# Patient Record
Sex: Female | Born: 1965 | Race: White | Hispanic: No | Marital: Married | Smoking: Former smoker
Health system: Southern US, Community
[De-identification: ages and names within clinical notes are randomized; demographics above are authoritative.]

## PROBLEM LIST (undated history)

## (undated) DIAGNOSIS — M79602 Pain in left arm: Secondary | ICD-10-CM

## (undated) DIAGNOSIS — E785 Hyperlipidemia, unspecified: Secondary | ICD-10-CM

## (undated) DIAGNOSIS — Z8249 Family history of ischemic heart disease and other diseases of the circulatory system: Secondary | ICD-10-CM

## (undated) DIAGNOSIS — R0789 Other chest pain: Secondary | ICD-10-CM

## (undated) DIAGNOSIS — I209 Angina pectoris, unspecified: Secondary | ICD-10-CM

## (undated) DIAGNOSIS — E041 Nontoxic single thyroid nodule: Secondary | ICD-10-CM

## (undated) DIAGNOSIS — M25512 Pain in left shoulder: Secondary | ICD-10-CM

## (undated) DIAGNOSIS — E7800 Pure hypercholesterolemia, unspecified: Secondary | ICD-10-CM

## (undated) HISTORY — DX: Other chest pain: R07.89

## (undated) HISTORY — PX: WISDOM TOOTH EXTRACTION: SHX21

## (undated) HISTORY — DX: Family history of ischemic heart disease and other diseases of the circulatory system: Z82.49

## (undated) HISTORY — DX: Pure hypercholesterolemia, unspecified: E78.00

## (undated) HISTORY — DX: Pain in left arm: M79.602

## (undated) HISTORY — DX: Hyperlipidemia, unspecified: E78.5

## (undated) HISTORY — DX: Pain in left shoulder: M25.512

---

## 1997-12-13 ENCOUNTER — Ambulatory Visit (HOSPITAL_COMMUNITY): Admission: RE | Admit: 1997-12-13 | Discharge: 1997-12-13 | Payer: Self-pay | Admitting: Obstetrics and Gynecology

## 1998-02-28 ENCOUNTER — Other Ambulatory Visit: Admission: RE | Admit: 1998-02-28 | Discharge: 1998-02-28 | Payer: Self-pay | Admitting: Obstetrics and Gynecology

## 1998-09-25 ENCOUNTER — Inpatient Hospital Stay (HOSPITAL_COMMUNITY): Admission: AD | Admit: 1998-09-25 | Discharge: 1998-09-28 | Payer: Self-pay | Admitting: Obstetrics and Gynecology

## 1998-11-08 ENCOUNTER — Other Ambulatory Visit: Admission: RE | Admit: 1998-11-08 | Discharge: 1998-11-08 | Payer: Self-pay | Admitting: Obstetrics and Gynecology

## 1999-09-16 HISTORY — PX: TUBAL LIGATION: SHX77

## 2000-01-13 ENCOUNTER — Other Ambulatory Visit: Admission: RE | Admit: 2000-01-13 | Discharge: 2000-01-13 | Payer: Self-pay | Admitting: Obstetrics and Gynecology

## 2000-07-04 ENCOUNTER — Encounter (INDEPENDENT_AMBULATORY_CARE_PROVIDER_SITE_OTHER): Payer: Self-pay

## 2000-07-04 ENCOUNTER — Inpatient Hospital Stay (HOSPITAL_COMMUNITY): Admission: AD | Admit: 2000-07-04 | Discharge: 2000-07-06 | Payer: Self-pay | Admitting: Obstetrics and Gynecology

## 2000-08-17 ENCOUNTER — Other Ambulatory Visit: Admission: RE | Admit: 2000-08-17 | Discharge: 2000-08-17 | Payer: Self-pay | Admitting: Obstetrics and Gynecology

## 2001-08-18 ENCOUNTER — Other Ambulatory Visit: Admission: RE | Admit: 2001-08-18 | Discharge: 2001-08-18 | Payer: Self-pay | Admitting: Obstetrics and Gynecology

## 2002-09-21 ENCOUNTER — Other Ambulatory Visit: Admission: RE | Admit: 2002-09-21 | Discharge: 2002-09-21 | Payer: Self-pay | Admitting: Obstetrics and Gynecology

## 2003-11-03 ENCOUNTER — Other Ambulatory Visit: Admission: RE | Admit: 2003-11-03 | Discharge: 2003-11-03 | Payer: Self-pay | Admitting: Obstetrics and Gynecology

## 2004-11-15 ENCOUNTER — Other Ambulatory Visit: Admission: RE | Admit: 2004-11-15 | Discharge: 2004-11-15 | Payer: Self-pay | Admitting: Obstetrics and Gynecology

## 2005-12-18 ENCOUNTER — Other Ambulatory Visit: Admission: RE | Admit: 2005-12-18 | Discharge: 2005-12-18 | Payer: Self-pay | Admitting: Obstetrics and Gynecology

## 2006-11-13 ENCOUNTER — Ambulatory Visit: Payer: Self-pay | Admitting: Family Medicine

## 2007-06-07 ENCOUNTER — Ambulatory Visit: Payer: Self-pay | Admitting: Family Medicine

## 2007-06-07 DIAGNOSIS — M940 Chondrocostal junction syndrome [Tietze]: Secondary | ICD-10-CM | POA: Insufficient documentation

## 2008-01-13 ENCOUNTER — Encounter: Admission: RE | Admit: 2008-01-13 | Discharge: 2008-01-13 | Payer: Self-pay | Admitting: Obstetrics and Gynecology

## 2008-06-16 ENCOUNTER — Encounter: Admission: RE | Admit: 2008-06-16 | Discharge: 2008-06-16 | Payer: Self-pay | Admitting: Obstetrics and Gynecology

## 2008-07-31 ENCOUNTER — Encounter: Admission: RE | Admit: 2008-07-31 | Discharge: 2008-07-31 | Payer: Self-pay | Admitting: *Deleted

## 2008-07-31 ENCOUNTER — Encounter (INDEPENDENT_AMBULATORY_CARE_PROVIDER_SITE_OTHER): Payer: Self-pay | Admitting: Diagnostic Radiology

## 2009-03-14 ENCOUNTER — Encounter: Admission: RE | Admit: 2009-03-14 | Discharge: 2009-03-14 | Payer: Self-pay | Admitting: Obstetrics and Gynecology

## 2009-07-21 ENCOUNTER — Emergency Department (HOSPITAL_COMMUNITY): Admission: EM | Admit: 2009-07-21 | Discharge: 2009-07-21 | Payer: Self-pay | Admitting: Emergency Medicine

## 2010-10-03 ENCOUNTER — Ambulatory Visit: Payer: Self-pay | Admitting: Cardiology

## 2010-10-05 ENCOUNTER — Encounter: Payer: Self-pay | Admitting: Obstetrics and Gynecology

## 2010-10-08 ENCOUNTER — Ambulatory Visit: Payer: Self-pay | Admitting: Cardiology

## 2010-12-18 LAB — POCT CARDIAC MARKERS
CKMB, poc: <1 ng/mL — ABNORMAL LOW (ref 1.0–8.0)
Myoglobin, poc: 48.6 ng/mL (ref 12–200)

## 2010-12-18 LAB — DIFFERENTIAL
Basophils Absolute: 0.1 10*3/uL (ref 0.0–0.1)
Basophils Relative: 1 % (ref 0–1)
Eosinophils Absolute: 0.2 10*3/uL (ref 0.0–0.7)
Eosinophils Relative: 2 % (ref 0–5)
Monocytes Absolute: 0.6 10*3/uL (ref 0.1–1.0)
Monocytes Relative: 6 % (ref 3–12)

## 2010-12-18 LAB — BASIC METABOLIC PANEL
CO2: 27 mEq/L (ref 19–32)
Calcium: 9.1 mg/dL (ref 8.4–10.5)
Chloride: 104 mEq/L (ref 96–112)
Glucose, Bld: 91 mg/dL (ref 70–99)
Sodium: 138 mEq/L (ref 135–145)

## 2010-12-18 LAB — CBC
HCT: 38.1 % (ref 36.0–46.0)
Hemoglobin: 12.8 g/dL (ref 12.0–15.0)
MCHC: 33.7 g/dL (ref 30.0–36.0)
MCV: 86.6 fL (ref 78.0–100.0)
RBC: 4.4 MIL/uL (ref 3.87–5.11)
RDW: 12.1 % (ref 11.5–15.5)

## 2011-01-31 NOTE — Op Note (Signed)
[facility]  Patient:    Kim Lee, Kim Lee                   MRN: [id number] Proc. Date: 07/04/00 Adm. Date:  [id number] Attending:  Frederich Balding Dictator:   n                           Operative Report  PREOPERATIVE DIAGNOSIS:       Multiparity, desires sterility.  POSTOPERATIVE DIAGNOSIS:      Multiparity, desires sterility.  OPERATION:                    Postpartum bilateral tubal ligation.  SURGEON:                      Juluis Mire, M.D.  ANESTHESIA:                   General endotracheal.  ESTIMATED BLOOD LOSS:         Minimal.  PACKS AND DRAINS:             None.  INTRAOPERATIVE BLOOD:         None.  COMPLICATIONS:                None.  INDICATIONS:                  The patient is a 45 year old gravida 5, para 4, abortus 1, married white female who delivered yesterday.  She was desirous of permanent sterilization.  Alternative forms of birth control were discussed. A failure rate of 1-200 was quoted.  Failures can be in the form of ectopic pregnancy requiring further surgical management.  The risks of surgery had been discussed including the risk of anesthesia.  The risk of infection.  The risk of hemorrhage that could necessitate transfusion with the risk of AIDS or hepatitis.  The risk of injury to adjacent organs could require further exploratory surgery.  The risk of a deviance or pulmonary embolus.  The patient voiced understanding of indications, risks, and other options.  DESCRIPTION OF PROCEDURE:     The patient was taken to the operating room and placed in the supine position.  After a satisfactory level of general endotracheal anesthesia had been obtained, the abdomen was prepped out with Betadine and draped in a sterile field.  A subumbilical incision was then made a knife and carried through the subcutaneous tissue.  The fascia was identified and entered sharply and the incision was extended laterally.   The peritoneum was identified and entered sharply.  There was no evidence of injury to adjacent organs.  The left tube was first identified and follow out to its fimbriated end.  A midsegment of tube was elevated through the incision.  A hole was made in the avascular mesosalpinx.  Individual ligatures of 0 plain catgut were used to ligate off the segment and tube.  The remaining segment and tube was then excised.  Cut ends of the tubes were cauterized using the Bovie.  Hemostasis was excellent with good tubal separation.  The left ovary was palpably normal.  Next, the right tube was identified by following it out to its fimbriated end.  A midsegment of tube was elevated through the incision.  A hole was made in the avascular area of the mesosalpinx, and individual ligatures of 0 plain catgut was used to ligate off  a segment of tube.  The intervening segment of tube was then excised.  Cut end of the tubes were then cauterized using the Bovie.  We had good hemostasis. Right ovary appeared and felt normal.  We had good hemostasis bilaterally at this point in time.  The fascia was closed with a running suture of 0 Vicryl. The skin was closed with a running subcuticular 4-0 Vicryl.  Sponge, needle and instrument counts reported as correct by the circulating nurse x 2.  The patient tolerated the procedure well and once was extubated, was transferred to the recovery room in good condition. DD:  07/05/00 TD:  07/05/00 Job: [id number]

## 2011-05-05 ENCOUNTER — Encounter: Payer: Self-pay | Admitting: *Deleted

## 2011-05-05 ENCOUNTER — Other Ambulatory Visit: Payer: Self-pay | Admitting: Cardiology

## 2011-05-05 DIAGNOSIS — Z8249 Family history of ischemic heart disease and other diseases of the circulatory system: Secondary | ICD-10-CM | POA: Insufficient documentation

## 2011-05-05 DIAGNOSIS — E7800 Pure hypercholesterolemia, unspecified: Secondary | ICD-10-CM

## 2011-05-08 ENCOUNTER — Other Ambulatory Visit (INDEPENDENT_AMBULATORY_CARE_PROVIDER_SITE_OTHER): Payer: BC Managed Care – PPO | Admitting: *Deleted

## 2011-05-08 DIAGNOSIS — E7800 Pure hypercholesterolemia, unspecified: Secondary | ICD-10-CM

## 2011-05-08 DIAGNOSIS — Z8249 Family history of ischemic heart disease and other diseases of the circulatory system: Secondary | ICD-10-CM

## 2011-05-08 LAB — BASIC METABOLIC PANEL
BUN: 11 mg/dL (ref 6–23)
CO2: 28 mEq/L (ref 19–32)
Chloride: 106 mEq/L (ref 96–112)
Creatinine, Ser: 0.7 mg/dL (ref 0.4–1.2)
Potassium: 4.5 mEq/L (ref 3.5–5.1)

## 2011-05-08 LAB — LIPID PANEL
Cholesterol: 195 mg/dL (ref 0–200)
LDL Cholesterol: 129 mg/dL — ABNORMAL HIGH (ref 0–99)
Triglycerides: 67 mg/dL (ref 0.0–149.0)

## 2011-05-08 LAB — HEPATIC FUNCTION PANEL
ALT: 13 U/L (ref 0–35)
AST: 15 U/L (ref 0–37)
Bilirubin, Direct: 0.1 mg/dL (ref 0.0–0.3)
Total Bilirubin: 0.6 mg/dL (ref 0.3–1.2)
Total Protein: 7 g/dL (ref 6.0–8.3)

## 2011-05-14 ENCOUNTER — Ambulatory Visit (INDEPENDENT_AMBULATORY_CARE_PROVIDER_SITE_OTHER): Payer: BC Managed Care – PPO | Admitting: Cardiology

## 2011-05-14 ENCOUNTER — Encounter: Payer: Self-pay | Admitting: Cardiology

## 2011-05-14 VITALS — BP 100/70 | HR 64 | Wt 147.0 lb

## 2011-05-14 DIAGNOSIS — Z8249 Family history of ischemic heart disease and other diseases of the circulatory system: Secondary | ICD-10-CM

## 2011-05-14 DIAGNOSIS — E7800 Pure hypercholesterolemia, unspecified: Secondary | ICD-10-CM

## 2011-05-14 MED ORDER — ATORVASTATIN CALCIUM 20 MG PO TABS: 20.0000 mg | ORAL_TABLET | Freq: Every day | ORAL | Status: DC

## 2011-05-14 NOTE — Assessment & Plan Note (Signed)
The patient has felt well.  She has been off her statin therapy.  We gave her a new prescription for Lipitor 20 mg daily today and she will remember to start taking it.

## 2011-05-14 NOTE — Assessment & Plan Note (Signed)
The patient has a family history of coronary disease but she herself has had no symptoms of angina and she had a previous normal stress test.  She is thinking about running a 5K race and wonders what I thought about it and I told her that I thought that it would be a good idea

## 2011-05-14 NOTE — Progress Notes (Signed)
Kim Lee Date of Birth:  1966/04/23 [facility] / [facility] [address]   [address] [place], [place]  [address] [phone]           Fax   [phone]  HPI: This pleasant 45 year old woman has a history of hypercholesterolemia.  She has not been taking her Lipitor for the past 2 or 3 months.  She just simply forgot to take it over the summer.  Her blood work today reflects the fact that she has not been on any statin and her LDL is high.  She denies any chest pain or shortness of breath.  She does not have any history of ischemic heart disease and she had a normal treadmill Cardiolite stress test 07/31/09  Current Outpatient Prescriptions  Medication Sig Dispense Refill  . atorvastatin (LIPITOR) 20 MG tablet Take 1 tablet (20 mg total) by mouth daily.  90 tablet  3  . FLUoxetine (PROZAC) 20 MG capsule Take 20 mg by mouth. 2 (two) weeks a month       . nitroGLYCERIN (NITROSTAT) 0.4 MG SL tablet Place 0.4 mg under the tongue every 5 (five) minutes as needed.          No Known Allergies  Patient Active Problem List  Diagnoses  . COSTOCHONDRITIS  . Hypercholesterolemia  . Family history of premature CAD    History  Smoking status  . Former Smoker -- 1.0 packs/day for 15 years  . Types: Cigarettes  . Quit date: 05/05/1999  Smokeless tobacco  . Never Used    History  Alcohol Use No    Family History  Problem Relation Age of Onset  . Diabetes Father   . Cancer Father   . Coronary artery disease Mother     with stents  . Hypertension Mother   . Hypertension Sister     Review of Systems: The patient denies any heat or cold intolerance.  No weight gain or weight loss.  The patient denies headaches or blurry vision.  There is no cough or sputum production.  The patient denies dizziness.  There is no hematuria or hematochezia.  The patient denies any muscle aches or arthritis.  The patient denies any rash.  The patient denies frequent falling or  instability.  There is no history of depression or anxiety.  All other systems were reviewed and are negative.   Physical Exam: Filed Vitals:   05/14/11 1608  BP: 100/70  Pulse: 64  The general appearance reveals a well-developed well-nourished woman in no distress.The head and neck exam reveals pupils equal and reactive.  Extraocular movements are full.  There is no scleral icterus.  The mouth and pharynx are normal.  The neck is supple.  The carotids reveal no bruits.  The jugular venous pressure is normal.  The  thyroid is not enlarged.  There is no lymphadenopathy.  The chest is clear to percussion and auscultation.  There are no rales or rhonchi.  Expansion of the chest is symmetrical.  The precordium is quiet.  The first heart sound is normal.  The second heart sound is physiologically split.  There is no murmur gallop rub or click.  There is no abnormal lift or heave.  The abdomen is soft and nontender.  The bowel sounds are normal.  The liver and spleen are not enlarged.  There are no abdominal masses.  There are no abdominal bruits.  Extremities reveal good pedal pulses.  There is no phlebitis or edema.  There is no cyanosis or clubbing.  Strength is normal and symmetrical in all extremities.  There is no lateralizing weakness.  There are no sensory deficits.  The skin is warm and dry.  There is no rash.      Assessment / Plan:  Restart Lipitor 20 mg daily.  Recheck 6 months for followup office visit and fasting lab work.  Okay to start training for a 5K race

## 2011-07-29 ENCOUNTER — Other Ambulatory Visit: Payer: Self-pay | Admitting: Orthopedic Surgery

## 2011-07-30 ENCOUNTER — Encounter (HOSPITAL_BASED_OUTPATIENT_CLINIC_OR_DEPARTMENT_OTHER): Payer: Self-pay | Admitting: Anesthesiology

## 2011-07-30 ENCOUNTER — Other Ambulatory Visit: Payer: Self-pay | Admitting: Orthopedic Surgery

## 2011-07-30 ENCOUNTER — Encounter (HOSPITAL_BASED_OUTPATIENT_CLINIC_OR_DEPARTMENT_OTHER)
Admission: RE | Disposition: A | Discharge status: Home / Self Care | Payer: Self-pay | Source: Ambulatory Visit | Attending: Orthopedic Surgery

## 2011-07-30 ENCOUNTER — Ambulatory Visit (HOSPITAL_BASED_OUTPATIENT_CLINIC_OR_DEPARTMENT_OTHER)
Admission: RE | Admit: 2011-07-30 | Discharge: 2011-07-30 | Disposition: A | Discharge status: Home / Self Care | Payer: BC Managed Care – PPO | Source: Ambulatory Visit | Attending: Orthopedic Surgery | Admitting: Orthopedic Surgery

## 2011-07-30 ENCOUNTER — Encounter (HOSPITAL_BASED_OUTPATIENT_CLINIC_OR_DEPARTMENT_OTHER): Payer: Self-pay | Admitting: *Deleted

## 2011-07-30 ENCOUNTER — Ambulatory Visit (HOSPITAL_BASED_OUTPATIENT_CLINIC_OR_DEPARTMENT_OTHER): Payer: BC Managed Care – PPO | Admitting: Anesthesiology

## 2011-07-30 DIAGNOSIS — I82891 Chronic embolism and thrombosis of other specified veins: Secondary | ICD-10-CM | POA: Insufficient documentation

## 2011-07-30 HISTORY — DX: Angina pectoris, unspecified: I20.9

## 2011-07-30 HISTORY — PX: MASS EXCISION: SHX2000

## 2011-07-30 LAB — POCT I-STAT, CHEM 8
Creatinine, Ser: 0.6 mg/dL (ref 0.50–1.10)
Glucose, Bld: 84 mg/dL (ref 70–99)
HCT: 42 % (ref 36.0–46.0)
Hemoglobin: 14.3 g/dL (ref 12.0–15.0)
Potassium: 3.9 mEq/L (ref 3.5–5.1)
Sodium: 139 mEq/L (ref 135–145)
TCO2: 20 mmol/L (ref 0–100)

## 2011-07-30 SURGERY — EXCISION MASS
Anesthesia: Choice | Laterality: Left | Wound class: Clean

## 2011-07-30 MED ORDER — BUPIVACAINE HCL (PF) 0.25 % IJ SOLN
INTRAMUSCULAR | Status: DC | PRN
  Administered 2011-07-30: 2 mL

## 2011-07-30 MED ORDER — LACTATED RINGERS IV SOLN
INTRAVENOUS | Status: DC
  Administered 2011-07-30 (×2): via INTRAVENOUS

## 2011-07-30 MED ORDER — MEPERIDINE HCL 25 MG/ML IJ SOLN
6.2500 mg | INTRAMUSCULAR | Status: DC | PRN
Start: 2011-07-30 — End: 2011-07-30

## 2011-07-30 MED ORDER — ONDANSETRON HCL 4 MG/2ML IJ SOLN: 4.0000 mg | Freq: Once | INTRAMUSCULAR | Status: DC | PRN

## 2011-07-30 MED ORDER — HYDROMORPHONE HCL PF 1 MG/ML IJ SOLN: 0.2500 mg | INTRAMUSCULAR | Status: DC | PRN

## 2011-07-30 MED ORDER — MIDAZOLAM HCL 5 MG/5ML IJ SOLN
INTRAMUSCULAR | Status: DC | PRN
  Administered 2011-07-30: 2 mg via INTRAVENOUS

## 2011-07-30 MED ORDER — CEFAZOLIN SODIUM 1-5 GM-% IV SOLN: 1.0000 g | Freq: Once | INTRAVENOUS | Status: DC

## 2011-07-30 MED ORDER — LIDOCAINE HCL (PF) 0.5 % IJ SOLN
INTRAMUSCULAR | Status: DC | PRN
  Administered 2011-07-30: 6 mL

## 2011-07-30 MED ORDER — DEXAMETHASONE SODIUM PHOSPHATE 10 MG/ML IJ SOLN
INTRAMUSCULAR | Status: DC | PRN
  Administered 2011-07-30: 10 mg via INTRAVENOUS

## 2011-07-30 MED ORDER — CHLORHEXIDINE GLUCONATE 4 % EX LIQD: 60.0000 mL | Freq: Once | CUTANEOUS | Status: DC

## 2011-07-30 MED ORDER — HYDROCODONE-ACETAMINOPHEN 5-500 MG PO TABS: 1.0000 | ORAL_TABLET | Freq: Four times a day (QID) | ORAL | Status: AC | PRN

## 2011-07-30 MED ORDER — FENTANYL CITRATE 0.05 MG/ML IJ SOLN
INTRAMUSCULAR | Status: DC | PRN
  Administered 2011-07-30: 50 ug via INTRAVENOUS

## 2011-07-30 MED ORDER — ONDANSETRON HCL 4 MG/2ML IJ SOLN
INTRAMUSCULAR | Status: DC | PRN
  Administered 2011-07-30: 4 mg via INTRAVENOUS

## 2011-07-30 MED ORDER — PROPOFOL 10 MG/ML IV EMUL
INTRAVENOUS | Status: DC | PRN
  Administered 2011-07-30: 75 ug/kg/min via INTRAVENOUS

## 2011-07-30 MED ORDER — MORPHINE SULFATE 2 MG/ML IJ SOLN: 0.0500 mg/kg | INTRAMUSCULAR | Status: DC | PRN

## 2011-07-30 MED ORDER — CEFAZOLIN SODIUM 1-5 GM-% IV SOLN
INTRAVENOUS | Status: DC | PRN
  Administered 2011-07-30: 1 g via INTRAVENOUS

## 2011-07-30 SURGICAL SUPPLY — 24 items
BANDAGE COBAN STERILE 2 (GAUZE/BANDAGES/DRESSINGS) ×1 IMPLANT
BLADE SURG 15 STRL LF DISP TIS (BLADE) IMPLANT
BLADE SURG 15 STRL SS (BLADE) ×2
CHLORAPREP W/TINT 26ML (MISCELLANEOUS) ×1 IMPLANT
CLOTH BEACON ORANGE TIMEOUT ST (SAFETY) ×1 IMPLANT
CORDS BIPOLAR (ELECTRODE) ×1 IMPLANT
COVER MAYO STAND STRL (DRAPES) ×1 IMPLANT
COVER TABLE BACK 60X90 (DRAPES) ×1 IMPLANT
CUFF TOURNIQUET SINGLE 18IN (TOURNIQUET CUFF) ×1 IMPLANT
DRAPE EXTREMITY T 121X128X90 (DRAPE) ×1 IMPLANT
DRAPE SURG 17X23 STRL (DRAPES) ×1 IMPLANT
GLOVE SURG ORTHO 8.0 STRL STRW (GLOVE) ×1 IMPLANT
GOWN BRE IMP PREV XXLGXLNG (GOWN DISPOSABLE) ×1 IMPLANT
GOWN PREVENTION PLUS XLARGE (GOWN DISPOSABLE) ×1 IMPLANT
NEEDLE 27GAX1X1/2 (NEEDLE) ×1 IMPLANT
NS IRRIG 1000ML POUR BTL (IV SOLUTION) ×1 IMPLANT
PACK BASIN DAY SURGERY FS (CUSTOM PROCEDURE TRAY) ×1 IMPLANT
SPONGE GAUZE 4X4 12PLY (GAUZE/BANDAGES/DRESSINGS) ×1 IMPLANT
STOCKINETTE 4X48 STRL (DRAPES) ×1 IMPLANT
SUT VICRYL RAPIDE 4/0 PS 2 (SUTURE) ×1 IMPLANT
SYR BULB 3OZ (MISCELLANEOUS) ×1 IMPLANT
SYR CONTROL 10ML LL (SYRINGE) ×1 IMPLANT
TOWEL OR 17X24 6PK STRL BLUE (TOWEL DISPOSABLE) ×1 IMPLANT
UNDERPAD 30X30 INCONTINENT (UNDERPADS AND DIAPERS) ×1 IMPLANT

## 2011-07-30 NOTE — Anesthesia Preprocedure Evaluation (Addendum)
Anesthesia Evaluation  Patient identified by MRN, date of birth, ID band Patient awake    Reviewed: Allergy & Precautions, H&P , NPO status , Patient's Chart, lab work & pertinent test results  Airway Mallampati: I TM Distance: >3 FB Neck ROM: Full    Dental   Pulmonary    Pulmonary exam normal       Cardiovascular - angina    Neuro/Psych    GI/Hepatic   Endo/Other    Renal/GU      Musculoskeletal   Abdominal   Peds  Hematology   Anesthesia Other Findings   Reproductive/Obstetrics                          Anesthesia Physical Anesthesia Plan  ASA: I  Anesthesia Plan: Bier Block   Post-op Pain Management:    Induction: Intravenous  Airway Management Planned: Simple Face Mask  Additional Equipment:   Intra-op Plan:   Post-operative Plan:   Informed Consent: I have reviewed the patients History and Physical, chart, labs and discussed the procedure including the risks, benefits and alternatives for the proposed anesthesia with the patient or authorized representative who has indicated his/her understanding and acceptance.   Dental advisory given  Plan Discussed with: CRNA and Surgeon  Anesthesia Plan Comments:         Anesthesia Quick Evaluation

## 2011-07-30 NOTE — Op Note (Signed)
Dictated [id number]

## 2011-07-30 NOTE — Transfer of Care (Signed)
Immediate Anesthesia Transfer of Care Note  Patient: Kim Lee  Procedure(s) Performed:  EXCISION MASS - left hand  Patient Location: PACU  Anesthesia Type: MAC  Level of Consciousness: awake, alert  and oriented  Airway & Oxygen Therapy: Patient Spontanous Breathing and Patient connected to face mask oxygen  Post-op Assessment: Report given to PACU RN and Post -op Vital signs reviewed and stable  Post vital signs: Reviewed and stable  Complications: No apparent anesthesia complications

## 2011-07-30 NOTE — Progress Notes (Signed)
Vicodin prescripton called in to pharmacy [facility] and prescripton destroyed

## 2011-07-30 NOTE — H&P (Signed)
[name] is a 45 year old left hand dominant female referred by Dr. Milus Glazier for a consultation with respect to a mass on the dorsal aspect of her left hand between the index and middle finger and the dorsal web space. This was aspirated in July with a thick bloody fluid. It has subsequently recurred. She recalls no history of injury. She is not complaining of any pain or discomfort with it. She has an occasional aching pain and it does cause problems. she states it is getting larger. Activity makes it worse. No history of diabetes, thyroid problems, arthritis or gout. There is a family history of diabetes and she has been tested.hand  Kim Lee is an 45 y.o. female.   Chief Complaint: mass left hand HPI: see above  Past Medical History  Diagnosis Date  . Hypercholesterolemia   . Atypical chest pain     History of   . Left arm pain     History of   . Left shoulder pain     History of   . Family history of premature CAD   . Angina     2 years ago.  1 episode,  Sees Dr. Patty Sermons    Past Surgical History  Procedure Date  . Tubal ligation 2001    Family History  Problem Relation Age of Onset  . Diabetes Father   . Cancer Father   . Coronary artery disease Mother     with stents  . Hypertension Mother   . Hypertension Sister    Social History:  reports that she quit smoking about 12 years ago. Her smoking use included Cigarettes. She has a 15 pack-year smoking history. She has never used smokeless tobacco. She reports that she does not drink alcohol or use illicit drugs.  Allergies: No Known Allergies  Medications Prior to Admission  Medication Dose Route Frequency Provider Last Rate Last Dose  . ceFAZolin (ANCEF) IVPB 1 g/50 mL premix  1 g Intravenous Once Nicki Reaper, MD      . lactated ringers infusion   Intravenous Continuous Nicki Reaper, MD 20 mL/hr at 07/30/11 1415     Medications Prior to Admission  Medication Sig Dispense Refill  . atorvastatin  (LIPITOR) 20 MG tablet Take 1 tablet (20 mg total) by mouth daily.  90 tablet  3  . FLUoxetine (PROZAC) 20 MG capsule Take 20 mg by mouth. 2 (two) weeks a month       . nitroGLYCERIN (NITROSTAT) 0.4 MG SL tablet Place 0.4 mg under the tongue every 5 (five) minutes as needed.         Results for orders placed during the hospital encounter of 07/30/11 (from the past 48 hour(s))  POCT I-STAT, CHEM 8     Status: Abnormal   Collection Time   07/30/11  2:23 PM      Component Value Range Comment   Sodium 139  135 - 145 (mEq/L)    Potassium 3.9  3.5 - 5.1 (mEq/L)    Chloride 108  96 - 112 (mEq/L)    BUN 9  6 - 23 (mg/dL)    Creatinine, Ser 0.45  0.50 - 1.10 (mg/dL)    Glucose, Bld 84  70 - 99 (mg/dL)    Calcium, Ion 4.09 (*) 1.12 - 1.32 (mmol/L)    TCO2 20  0 - 100 (mmol/L)    Hemoglobin 14.3  12.0 - 15.0 (g/dL)    HCT 81.1  91.4 - 78.2 (%)  No results found.   A comprehensive review of systems was negative.  Blood pressure 115/77, pulse 70, temperature 98 F (36.7 C), temperature source Oral, resp. rate 18, height 5\' 6"  (1.676 m), weight 65.772 kg (145 lb), last menstrual period 07/10/2011, SpO2 100.00%.  General appearance: alert, cooperative and appears stated age Head: atraumatic Neck: no adenopathy Resp: clear to auscultation bilaterally Cardio: regular rate and rhythm, S1, S2 normal, no murmur, click, rub or gallop GI: soft, non-tender; bowel sounds normal; no masses,  no organomegaly Extremities: extremities normal, atraumatic, no cyanosis or edema Pulses: 2+ and symmetric Skin: Skin color, texture, turgor normal. No rashes or lesions Neurologic: Grossly normal Incision/Wound: na  Assessment/Plan Excision mass  Andriea Hasegawa R 07/30/2011, 4:05 PM

## 2011-07-30 NOTE — Anesthesia Postprocedure Evaluation (Signed)
  Anesthesia Post-op Note  Patient: Kim Lee  Procedure(s) Performed:  EXCISION MASS - left hand  Patient Location: PACU  Anesthesia Type: Bier block  Level of Consciousness: awake and alert   Airway and Oxygen Therapy: Patient Spontanous Breathing and Patient connected to face mask oxygen  Post-op Pain: mild  Post-op Assessment: Post-op Vital signs reviewed, Patient's Cardiovascular Status Stable, Respiratory Function Stable, Patent Airway, No signs of Nausea or vomiting and Pain level controlled  Post-op Vital Signs: Reviewed and stable  Complications: No apparent anesthesia complications

## 2011-07-30 NOTE — Brief Op Note (Signed)
07/30/2011  4:52 PM  PATIENT:  Kim Lee  45 y.o. female  PRE-OPERATIVE DIAGNOSIS:  mass left hand web, index, middle  POST-OPERATIVE DIAGNOSIS:  mass left hand web, index, middle  PROCEDURE:  Procedure(s): EXCISION MASS  SURGEON:  Surgeon(s): Nicki Reaper, MD Tami Ribas  PHYSICIAN ASSISTANT:   ASSISTANTS: none   ANESTHESIA:   local and regional  EBL:  Total I/O In: 1100 [I.V.:1100] Out: -   BLOOD ADMINISTERED:none  DRAINS: none   LOCAL MEDICATIONS USED:  MARCAINE 2CC  SPECIMEN:  Excision  DISPOSITION OF SPECIMEN:  PATHOLOGY  COUNTS:  YES  TOURNIQUET:   Total Tourniquet Time Documented: Forearm (Left) - 16 minutes  DICTATION: .Other Dictation: Dictation Number [id number]  PLAN OF CARE: Discharge to home after PACU  PATIENT DISPOSITION:  PACU - hemodynamically stable.

## 2011-07-31 NOTE — Op Note (Signed)
NAME:  EVADEAN, SPROULE NO.:  [id number]  MEDICAL RECORD NO.:  LOCATION:                                 FACILITY:  PHYSICIAN:  Cindee Salt, M.D.            DATE OF BIRTH:  DATE OF PROCEDURE: DATE OF DISCHARGE:                              OPERATIVE REPORT   PREOPERATIVE DIAGNOSIS:  Mass, left hand.  POSTOPERATIVE DIAGNOSIS:  Mass, left hand.  OPERATION:  Excisional biopsy mass, left hand.  PROCEDURE IN DETAIL:  The patient was brought to the operating room where a forearm based IV regional anesthetic was carried out without difficulty.  She was prepped using ChloraPrep, supine position, left arm free.  A 3-minute dry time was allowed.  Time-out taken, confirmed patient procedure.  After adequate anesthesia was afforded, a longitudinal incision was made over the mass carried down through subcutaneous tissue, a multilobulated fluid blood-filled mass was immediately apparent. Feeder vessels were noted proximally, distally, and volarly.  These were electrocoagulated with bipolar allowing complete resection and removal of the mass which was sent to Pathology. No further lesions were identified.  The wound was irrigated.  The skin then closed with interrupted 5-0 Vicryl Rapide sutures.  Local infiltration with 0.25% Marcaine without epinephrine was given, 2 mL was used.  A sterile compressive dressing was applied and deflation of the tourniquet.  All fingers immediately pinked.  She was taken to the recovery room for observation in satisfactory condition.          ______________________________ Cindee Salt, M.D.     GK/MEDQ  D:  07/30/2011  T:  07/30/2011  Job:  [id number]

## 2011-08-04 ENCOUNTER — Encounter (HOSPITAL_BASED_OUTPATIENT_CLINIC_OR_DEPARTMENT_OTHER): Payer: Self-pay | Admitting: Orthopedic Surgery

## 2011-12-11 ENCOUNTER — Other Ambulatory Visit: Payer: Self-pay | Admitting: *Deleted

## 2011-12-11 MED ORDER — NITROGLYCERIN 0.4 MG SL SUBL: 0.4000 mg | SUBLINGUAL_TABLET | SUBLINGUAL | Status: DC | PRN

## 2011-12-11 NOTE — Telephone Encounter (Signed)
Refilled ntg °

## 2012-07-12 ENCOUNTER — Encounter: Payer: Self-pay | Admitting: Family Medicine

## 2012-07-12 ENCOUNTER — Ambulatory Visit (INDEPENDENT_AMBULATORY_CARE_PROVIDER_SITE_OTHER): Payer: BC Managed Care – PPO | Admitting: Family Medicine

## 2012-07-12 VITALS — BP 98/70 | HR 75 | Temp 98.5°F | Ht 66.75 in | Wt 148.0 lb

## 2012-07-12 DIAGNOSIS — R1013 Epigastric pain: Secondary | ICD-10-CM | POA: Insufficient documentation

## 2012-07-12 DIAGNOSIS — M25519 Pain in unspecified shoulder: Secondary | ICD-10-CM

## 2012-07-12 DIAGNOSIS — K5900 Constipation, unspecified: Secondary | ICD-10-CM

## 2012-07-12 DIAGNOSIS — Z8249 Family history of ischemic heart disease and other diseases of the circulatory system: Secondary | ICD-10-CM

## 2012-07-12 DIAGNOSIS — E785 Hyperlipidemia, unspecified: Secondary | ICD-10-CM | POA: Insufficient documentation

## 2012-07-12 DIAGNOSIS — Z7689 Persons encountering health services in other specified circumstances: Secondary | ICD-10-CM

## 2012-07-12 DIAGNOSIS — Z7189 Other specified counseling: Secondary | ICD-10-CM

## 2012-07-12 MED ORDER — OMEPRAZOLE 20 MG PO CPDR: 20.0000 mg | DELAYED_RELEASE_CAPSULE | Freq: Every day | ORAL | Status: DC

## 2012-07-12 NOTE — Progress Notes (Signed)
Chief Complaint  Patient presents with  . Establish Care    HPI: Kim Lee is here to establish care. [employer]. 4 kids. Has the following concerns today:  Behind Left shoulder blade pain and  -also radiates to L front area epigastric -has had cardiac workup pin the past and normal -started several years -lasts few days when it have -initiated by: cold fluids and alcohol -other symptoms: belching, occ nausea -does not notice after meals -on and off constipation chronically -denies: fevers, chills, weight, vomiting, blood in stools, hx of h. pylori  Other Providers: -Dr. Patty Sermons in [place], for hx of CP, HLP, FH of CAD - has appt in November -Dr. Dalbert Mayotte in [place], gets paps, mammo there  Had flu shot at shot. Has not had tdap in the last ten years. Has not had tdap.    ROS: See pertinent positives and negatives per HPI.  Past Medical History  Diagnosis Date  . Hypercholesterolemia   . Atypical chest pain     History of   . Left arm pain     History of   . Left shoulder pain     History of   . Family history of premature CAD   . Angina     2 years ago.  1 episode,  Sees Dr. Patty Sermons  . Hyperlipidemia     Family History  Problem Relation Age of Onset  . Diabetes Father   . Cancer Father   . Coronary artery disease Mother     with stents  . Hypertension Mother   . Hypertension Sister     History   Social History  . Marital Status: Married    Spouse Name: N/A    Number of Children: N/A  . Years of Education: N/A   Social History Main Topics  . Smoking status: Former Smoker -- 1.0 packs/day for 15 years    Types: Cigarettes    Quit date: 05/05/1999  . Smokeless tobacco: Never Used  . Alcohol Use: No  . Drug Use: No  . Sexually Active: Yes   Other Topics Concern  . None   Social History Narrative  . None    Current outpatient prescriptions:atorvastatin (LIPITOR) 20 MG tablet, Take 1 tablet (20 mg total) by mouth daily.,  Disp: 90 tablet, Rfl: 3;  nitroGLYCERIN (NITROSTAT) 0.4 MG SL tablet, Place 1 tablet (0.4 mg total) under the tongue every 5 (five) minutes as needed., Disp: 25 tablet, Rfl: 11;  omeprazole (PRILOSEC) 20 MG capsule, Take 1 capsule (20 mg total) by mouth daily., Disp: 30 capsule, Rfl: 3  EXAM:  Filed Vitals:   07/12/12 1624  BP: 98/70  Pulse: 75  Temp: 98.5 F (36.9 C)    Body mass index is 23.35 kg/(m^2).  GENERAL: vitals reviewed and listed above, alert, oriented, appears well hydrated and in no acute distress  HEENT: atraumatic, conjunttiva clear, no obvious abnormalities on inspection of external nose and ears  NECK: no obvious masses on inspection  LUNGS: clear to auscultation bilaterally, no wheezes, rales or rhonchi, good air movement  CV: HRRR, no peripheral edema  ABD: BS+, soft, NTTP  MS: moves all extremities without noticeable abnormality  PSYCH: pleasant and cooperative, no obvious depression or anxiety  ASSESSMENT AND PLAN:  Discussed the following assessment and plan:  1. Encounter to establish care    2. Constipation  -daily fiber supplement  3. Epigastric pain, nausea and belching - occ and chronic -suspect may be reflux  -  CBC with Differential, CMP, H. pylori Antibody, IgG, Tissue transglutaminase, IgA, Amylase, Lipase -omeprazole (PRILOSEC) 20 MG capsule -follow up in 1-2 months or sooner if worsening or change in symptoms -has had cardiac workup in the past and has appt with cardiologist  4.  HLD, FH CAD - wants to get labs for diabetes and lipids when sees her cardiologist so fasting  -We reviewed the PMH, PSH, FH, SH, Meds and Allergies. -We addressed current concerns per orders and patient instructions. -We have asked for records for pertinent exams, studies, vaccines and notes from previous providers. -Will have fasting labs with her cardiologist -getting physical exam with her gynocologist -We have advised patient to follow up per instructions  below. -tdap vaccine given today  -Patient advised to return or notify a doctor immediately if symptoms worsen or persist or new concerns arise.  Patient Instructions  -We have ordered labs or studies at this visit. It can take up to 1-2 weeks for results and processing. We will contact you with instructions IF your results are abnormal. Normal results will be released to your [facility]. If you have not heard from [place] or can not find your results in [facility] in 2 weeks please contact our office.  -start the prilosec daily  -metamucil or other fiber supplement daily and plenty of water    -PLEASE SIGN UP FOR MYCHART TODAY   We recommend the following healthy lifestyle measures: - eat a healthy diet consisting of lots of vegetables, fruits, beans, nuts, seeds, healthy meats such as white chicken and fish and whole grains.  - avoid fried foods, fast food, processed foods, sodas, red meet and other fattening foods.  - get a least 150 minutes of aerobic exercise per week.   -keep appointments with cardiology and gynocology  Follow up in: 1-2 months      Kim Lee R.

## 2012-07-12 NOTE — Patient Instructions (Addendum)
-  We have ordered labs or studies at this visit. It can take up to 1-2 weeks for results and processing. We will contact you with instructions IF your results are abnormal. Normal results will be released to your [facility]. If you have not heard from [place] or can not find your results in [facility] in 2 weeks please contact our office.  -start the prilosec daily  -metamucil or other fiber supplement daily and plenty of water    -PLEASE SIGN UP FOR MYCHART TODAY   We recommend the following healthy lifestyle measures: - eat a healthy diet consisting of lots of vegetables, fruits, beans, nuts, seeds, healthy meats such as white chicken and fish and whole grains.  - avoid fried foods, fast food, processed foods, sodas, red meet and other fattening foods.  - get a least 150 minutes of aerobic exercise per week.   -keep appointments with cardiology and gynocology  Follow up in: 1-2 months

## 2012-07-13 LAB — COMPREHENSIVE METABOLIC PANEL
Alkaline Phosphatase: 45 U/L (ref 39–117)
CO2: 27 mEq/L (ref 19–32)
Creatinine, Ser: 0.7 mg/dL (ref 0.4–1.2)
GFR: 95.67 mL/min (ref >60.00)
Glucose, Bld: 84 mg/dL (ref 70–99)
Total Bilirubin: 0.3 mg/dL (ref 0.3–1.2)

## 2012-07-13 LAB — CBC WITH DIFFERENTIAL/PLATELET
Basophils Absolute: 0 10*3/uL (ref 0.0–0.1)
Eosinophils Relative: 1.8 % (ref 0.0–5.0)
HCT: 34 % — ABNORMAL LOW (ref 36.0–46.0)
Hemoglobin: 11.1 g/dL — ABNORMAL LOW (ref 12.0–15.0)
Lymphs Abs: 2 10*3/uL (ref 0.7–4.0)
MCV: 85.2 fl (ref 78.0–100.0)
Monocytes Absolute: 0.5 10*3/uL (ref 0.1–1.0)
Monocytes Relative: 8.8 % (ref 3.0–12.0)
Neutro Abs: 3.2 10*3/uL (ref 1.4–7.7)
RDW: 12.5 % (ref 11.5–14.6)

## 2012-07-13 LAB — LIPASE: Lipase: 27 U/L (ref 11.0–59.0)

## 2012-07-13 LAB — H. PYLORI ANTIBODY, IGG: H Pylori IgG: NEGATIVE

## 2012-07-14 ENCOUNTER — Telehealth: Payer: Self-pay | Admitting: Family Medicine

## 2012-07-14 DIAGNOSIS — D649 Anemia, unspecified: Secondary | ICD-10-CM

## 2012-07-14 NOTE — Telephone Encounter (Signed)
[phone] (home) [phone] (work) Discussed lab results possible etiologies mild anemia. No hx anemia. No GI symptoms, heavy or abnormal menstrual bleeding, hematochezia or melena. Occ epigastric and back pain for a long time - unchanged. Will repeat labs and anemia panel. Pt to make lab appt in next few days.

## 2012-07-19 ENCOUNTER — Ambulatory Visit: Payer: BC Managed Care – PPO | Admitting: Cardiology

## 2012-07-19 ENCOUNTER — Telehealth: Payer: Self-pay | Admitting: Family Medicine

## 2012-07-19 NOTE — Telephone Encounter (Signed)
Left a message for return call.  

## 2012-07-19 NOTE — Telephone Encounter (Signed)
Please find out when she is going to some in to repeat anemia labs. Thanks!

## 2012-07-19 NOTE — Telephone Encounter (Signed)
Called and spoke with pt and pt states she has an appt for 07/20/12.

## 2012-07-20 ENCOUNTER — Other Ambulatory Visit (INDEPENDENT_AMBULATORY_CARE_PROVIDER_SITE_OTHER): Payer: BC Managed Care – PPO

## 2012-07-20 DIAGNOSIS — D649 Anemia, unspecified: Secondary | ICD-10-CM

## 2012-07-20 LAB — CBC WITH DIFFERENTIAL/PLATELET
Eosinophils Relative: 1.4 % (ref 0.0–5.0)
HCT: 36.3 % (ref 36.0–46.0)
Monocytes Relative: 8.5 % (ref 3.0–12.0)
Neutrophils Relative %: 56.5 % (ref 43.0–77.0)
Platelets: 250 10*3/uL (ref 150.0–400.0)
WBC: 6.6 10*3/uL (ref 4.5–10.5)

## 2012-07-21 ENCOUNTER — Telehealth: Payer: Self-pay | Admitting: Family Medicine

## 2012-07-21 NOTE — Telephone Encounter (Signed)
Please let patient know, it looks like she has an iron deficiency anemia. I think given her symptoms of shoulder blade pain, belching occasional nausea that she should see the gastroenterologist to ensure no GI cause for her symptoms and her anemia. I will place this referral and then would like her to follow up with me 3 months.  Alisha, please send back to me if she is ok with this referral and I will put in order. Thanks.

## 2012-07-21 NOTE — Telephone Encounter (Signed)
Called and spoke with pt and pt is aware.  

## 2012-07-21 NOTE — Telephone Encounter (Signed)
Left a message for return call.  

## 2012-07-22 ENCOUNTER — Other Ambulatory Visit: Payer: Self-pay | Admitting: Family Medicine

## 2012-07-22 DIAGNOSIS — R110 Nausea: Secondary | ICD-10-CM

## 2012-07-22 DIAGNOSIS — R1013 Epigastric pain: Secondary | ICD-10-CM

## 2012-07-22 DIAGNOSIS — R142 Eructation: Secondary | ICD-10-CM

## 2012-07-22 DIAGNOSIS — D509 Iron deficiency anemia, unspecified: Secondary | ICD-10-CM

## 2012-07-22 LAB — ANEMIA PANEL
%SAT: 6 % — ABNORMAL LOW (ref 20–55)
ABS Retic: 51.7 10*3/uL (ref 19.0–186.0)
Ferritin: 7 ng/mL — ABNORMAL LOW (ref 10–291)
Folate: 20.3 ng/mL (ref >5.4)
Iron: 26 ug/dL — ABNORMAL LOW (ref 42–145)
RBC.: 4.31 MIL/uL (ref 3.87–5.11)
Retic Ct Pct: 1.2 % (ref 0.4–2.3)
TIBC: 424 ug/dL (ref 250–470)
UIBC: 398 ug/dL (ref 125–400)
Vitamin B-12: 436 pg/mL (ref 211–911)

## 2012-07-22 NOTE — Telephone Encounter (Signed)
Pt would like referral to GI.  Please order.

## 2012-07-22 NOTE — Telephone Encounter (Signed)
Referral corded.

## 2012-08-02 ENCOUNTER — Ambulatory Visit (INDEPENDENT_AMBULATORY_CARE_PROVIDER_SITE_OTHER): Payer: BC Managed Care – PPO | Admitting: Cardiology

## 2012-08-02 ENCOUNTER — Encounter: Payer: Self-pay | Admitting: Cardiology

## 2012-08-02 VITALS — BP 128/64 | HR 79 | Ht 66.0 in | Wt 145.0 lb

## 2012-08-02 DIAGNOSIS — E7800 Pure hypercholesterolemia, unspecified: Secondary | ICD-10-CM

## 2012-08-02 DIAGNOSIS — Z8249 Family history of ischemic heart disease and other diseases of the circulatory system: Secondary | ICD-10-CM

## 2012-08-02 MED ORDER — NITROGLYCERIN 0.4 MG SL SUBL: 0.4000 mg | SUBLINGUAL_TABLET | SUBLINGUAL | Status: DC | PRN

## 2012-08-02 NOTE — Patient Instructions (Addendum)
Your physician recommends that you continue on your current medications as directed. Please refer to the Current Medication list given to you today.  Your physician wants you to follow-up in: 1 year You will receive a reminder letter in the mail two months in advance. If you don't receive a letter, please call our office to schedule the follow-up appointment.   Come back on 08/11/12 for fasting labs

## 2012-08-02 NOTE — Assessment & Plan Note (Signed)
The patient is not having any clinical symptoms to suggest angina pectoris.  Exercise tolerance remains good.

## 2012-08-02 NOTE — Progress Notes (Signed)
Kim Lee Date of Birth:  [birth year] [facility] [address] [place], [place]  [address] [phone]  Fax   [phone]  HPI: This pleasant 46 year old woman has a history of hypercholesterolemia.  She does not have any history of ischemic heart disease and she had a normal treadmill Cardiolite stress test 07/31/09.  She exercises regularly and does some jogging occasionally.  She does not work out on a regular basis however.  She's had some occasional left-sided chest pain and left arm pain.  This is atypical and not felt to be ischemic.  The patient has a history of hypercholesterolemia and is on Lipitor.  Recently her primary care physician found that she is iron deficient and has referred her to GI.   Current Outpatient Prescriptions  Medication Sig Dispense Refill  . atorvastatin (LIPITOR) 20 MG tablet Take 1 tablet (20 mg total) by mouth daily.  90 tablet  3  . nitroGLYCERIN (NITROSTAT) 0.4 MG SL tablet Place 1 tablet (0.4 mg total) under the tongue every 5 (five) minutes as needed.  25 tablet  11  . omeprazole (PRILOSEC) 20 MG capsule Take 1 capsule (20 mg total) by mouth daily.  30 capsule  3  . [DISCONTINUED] nitroGLYCERIN (NITROSTAT) 0.4 MG SL tablet Place 1 tablet (0.4 mg total) under the tongue every 5 (five) minutes as needed.  25 tablet  11    No Known Allergies  Patient Active Problem List  Diagnosis  . COSTOCHONDRITIS  . Hypercholesterolemia  . Family history of premature CAD  . Constipation  . Epigastric pain  . Hyperlipemia    History  Smoking status  . Former Smoker -- 1.0 packs/day for 15 years  . Types: Cigarettes  . Quit date: 05/05/1999  Smokeless tobacco  . Never Used    History  Alcohol Use No    Family History  Problem Relation Age of Onset  . Diabetes Father   . Cancer Father   . Coronary artery disease Mother     with stents  . Hypertension Mother   . Hypertension Sister     Review of Systems: The  patient denies any heat or cold intolerance.  No weight gain or weight loss.  The patient denies headaches or blurry vision.  There is no cough or sputum production.  The patient denies dizziness.  There is no hematuria or hematochezia.  The patient denies any muscle aches or arthritis.  The patient denies any rash.  The patient denies frequent falling or instability.  There is no history of depression or anxiety.  All other systems were reviewed and are negative.   Physical Exam: Filed Vitals:   08/02/12 1635  BP: 128/64  Pulse: 79   general appearance reveals a healthy-appearing woman in no acute distress.The head and neck exam reveals pupils equal and reactive.  Extraocular movements are full.  There is no scleral icterus.  The mouth and pharynx are normal.  The neck is supple.  The carotids reveal no bruits.  The jugular venous pressure is normal.  The  thyroid is not enlarged.  There is no lymphadenopathy.  The chest is clear to percussion and auscultation.  There are no rales or rhonchi.  Expansion of the chest is symmetrical.  The precordium is quiet.  The first heart sound is normal.  The second heart sound is physiologically split.  There is no murmur gallop rub or click.  There is no abnormal lift or heave.  The abdomen is soft  and nontender.  The bowel sounds are normal.  The liver and spleen are not enlarged.  There are no abdominal masses.  There are no abdominal bruits.  Extremities reveal good pedal pulses.  There is no phlebitis or edema.  There is no cyanosis or clubbing.  Strength is normal and symmetrical in all extremities.  There is no lateralizing weakness.  There are no sensory deficits.  The skin is warm and dry.  There is no rash.  EKG shows normal sinus rhythm and is within normal limits.    Assessment / Plan: The patient appears to be stable from a cardiac standpoint.  She is in the process of being worked up for iron deficiency anemia and has an appointment to see GI  soon. She will return soon for fasting lipid panel and chemistries and she will continue same Lipitor.  Recheck in one year for followup office visit and fasting lab work.

## 2012-08-02 NOTE — Assessment & Plan Note (Signed)
The patient has been on Lipitor for her hypercholesterolemia.  He did not come fasting today.  We will have her return soon for fasting lipid panel hepatic function panel and basal metabolic panel.  She is not having any side effects from the Lipitor

## 2012-08-11 ENCOUNTER — Other Ambulatory Visit (INDEPENDENT_AMBULATORY_CARE_PROVIDER_SITE_OTHER): Payer: BC Managed Care – PPO

## 2012-08-11 DIAGNOSIS — E7800 Pure hypercholesterolemia, unspecified: Secondary | ICD-10-CM

## 2012-08-11 LAB — BASIC METABOLIC PANEL
BUN: 11 mg/dL (ref 6–23)
CO2: 26 mEq/L (ref 19–32)
Chloride: 107 mEq/L (ref 96–112)
Glucose, Bld: 93 mg/dL (ref 70–99)
Potassium: 4 mEq/L (ref 3.5–5.1)

## 2012-08-11 LAB — LIPID PANEL
LDL Cholesterol: 62 mg/dL (ref 0–99)
VLDL: 17.8 mg/dL (ref 0.0–40.0)

## 2012-08-11 LAB — HEPATIC FUNCTION PANEL
Bilirubin, Direct: 0.1 mg/dL (ref 0.0–0.3)
Total Bilirubin: 0.8 mg/dL (ref 0.3–1.2)

## 2012-08-13 NOTE — Progress Notes (Signed)
Quick Note:  Please report to patient. The recent labs are stable. Continue same medication and careful diet. ______ 

## 2012-08-19 ENCOUNTER — Telehealth: Payer: Self-pay | Admitting: *Deleted

## 2012-08-19 NOTE — Telephone Encounter (Signed)
Mailed copy of labs and left message to call if any questions  

## 2012-08-19 NOTE — Telephone Encounter (Signed)
Message copied by Burnell Blanks on Thu Aug 19, 2012  3:28 PM ------      Message from: Cassell Clement      Created: Fri Aug 13, 2012  2:45 PM       Please report to patient.  The recent labs are stable. Continue same medication and careful diet.

## 2012-09-22 ENCOUNTER — Other Ambulatory Visit: Payer: Self-pay

## 2012-09-22 DIAGNOSIS — E7800 Pure hypercholesterolemia, unspecified: Secondary | ICD-10-CM

## 2012-09-22 MED ORDER — ATORVASTATIN CALCIUM 20 MG PO TABS: 20.0000 mg | ORAL_TABLET | Freq: Every day | ORAL | Status: DC

## 2013-01-11 ENCOUNTER — Telehealth: Payer: Self-pay

## 2013-01-11 NOTE — Telephone Encounter (Signed)
Message copied by Azucena Freed on Tue Jan 11, 2013  9:24 AM ------      Message from: Terressa Koyanagi      Created: Sun Jan 09, 2013  8:57 AM       Can you see if she saw the gastroenterologist about her mild iron def anemia?       ------

## 2013-01-11 NOTE — Telephone Encounter (Signed)
Called and spoke with pt and pt states that she was never called about GI referral.  Pt states in the meantime she went to her GYN and her anemia was back up..  Pt states she feels this comes from donating blood and it takes some time to recover.

## 2013-07-21 ENCOUNTER — Other Ambulatory Visit: Payer: Self-pay

## 2013-08-01 ENCOUNTER — Ambulatory Visit (INDEPENDENT_AMBULATORY_CARE_PROVIDER_SITE_OTHER): Payer: BC Managed Care – PPO | Admitting: *Deleted

## 2013-08-01 DIAGNOSIS — E785 Hyperlipidemia, unspecified: Secondary | ICD-10-CM

## 2013-08-01 DIAGNOSIS — E7800 Pure hypercholesterolemia, unspecified: Secondary | ICD-10-CM

## 2013-08-01 LAB — HEPATIC FUNCTION PANEL
ALT: 14 U/L (ref 0–35)
AST: 15 U/L (ref 0–37)
Albumin: 4.2 g/dL (ref 3.5–5.2)
Alkaline Phosphatase: 42 U/L (ref 39–117)
Bilirubin, Direct: 0 mg/dL (ref 0.0–0.3)
Total Bilirubin: 0.6 mg/dL (ref 0.3–1.2)
Total Protein: 7.4 g/dL (ref 6.0–8.3)

## 2013-08-01 LAB — LIPID PANEL
Cholesterol: 147 mg/dL (ref 0–200)
HDL: 53.2 mg/dL
LDL Cholesterol: 81 mg/dL (ref 0–99)
Total CHOL/HDL Ratio: 3
Triglycerides: 65 mg/dL (ref 0.0–149.0)
VLDL: 13 mg/dL (ref 0.0–40.0)

## 2013-08-01 LAB — BASIC METABOLIC PANEL
BUN: 12 mg/dL (ref 6–23)
Chloride: 106 mEq/L (ref 96–112)
Potassium: 4.2 mEq/L (ref 3.5–5.1)

## 2013-08-01 NOTE — Progress Notes (Signed)
Quick Note:  Please make copy of labs for patient visit. ______ 

## 2013-08-04 ENCOUNTER — Ambulatory Visit (INDEPENDENT_AMBULATORY_CARE_PROVIDER_SITE_OTHER): Payer: BC Managed Care – PPO | Admitting: Cardiology

## 2013-08-04 ENCOUNTER — Encounter: Payer: Self-pay | Admitting: Cardiology

## 2013-08-04 VITALS — BP 120/82 | HR 69 | Ht 66.0 in | Wt 144.0 lb

## 2013-08-04 DIAGNOSIS — E7800 Pure hypercholesterolemia, unspecified: Secondary | ICD-10-CM

## 2013-08-04 DIAGNOSIS — Z8249 Family history of ischemic heart disease and other diseases of the circulatory system: Secondary | ICD-10-CM

## 2013-08-04 NOTE — Patient Instructions (Signed)
Your physician recommends that you continue on your current medications as directed. Please refer to the Current Medication list given to you today.  Your physician wants you to follow-up in: 1 year ov/lp/bmet/hfp/ekg  You will receive a reminder letter in the mail two months in advance. If you don't receive a letter, please call our office to schedule the follow-up appointment.  

## 2013-08-04 NOTE — Assessment & Plan Note (Signed)
Blood work is satisfactory on current dose of Lipitor 20 mg daily.  She is not having any myalgias.  She has occasional leg cramps which are not severe.  We talked about potential switching to a different statin but she does not think that her symptoms are severe enough at this time

## 2013-08-04 NOTE — Progress Notes (Signed)
Kim Lee Date of Birth:  04-29-1966 [address] [place], [place]  [address] [phone]  Fax   [phone]  HPI: This pleasant 47 year old woman has a history of hypercholesterolemia.  She does not have any history of ischemic heart disease and she had a normal treadmill Cardiolite stress test 07/31/09.  She exercises regularly and does some jogging.  She recently ran in a 5K race and came in 10th in her age group.  She does not work out on a regular basis however.    .  The patient has a history of hypercholesterolemia and is on Lipitor.  Recently her primary care physician found that she is iron deficient and placed her on proton pump inhibitor.  Subsequent iron studies came back normal.   Current Outpatient Prescriptions  Medication Sig Dispense Refill  . atorvastatin (LIPITOR) 20 MG tablet Take 1 tablet (20 mg total) by mouth daily.  90 tablet  3  . nitroGLYCERIN (NITROSTAT) 0.4 MG SL tablet Place 1 tablet (0.4 mg total) under the tongue every 5 (five) minutes as needed.  25 tablet  11  . omeprazole (PRILOSEC) 20 MG capsule Take 1 capsule (20 mg total) by mouth daily.  30 capsule  3   No current facility-administered medications for this visit.    No Known Allergies  Patient Active Problem List   Diagnosis Date Noted  . Constipation 07/12/2012  . Epigastric pain 07/12/2012  . Hyperlipemia 07/12/2012  . Hypercholesterolemia   . Family history of premature CAD   . COSTOCHONDRITIS 06/07/2007    History  Smoking status  . Former Smoker -- 1.00 packs/day for 15 years  . Types: Cigarettes  . Quit date: 05/05/1999  Smokeless tobacco  . Never Used    History  Alcohol Use No    Family History  Problem Relation Age of Onset  . Diabetes Father   . Cancer Father   . Coronary artery disease Mother     with stents  . Hypertension Mother   . Hypertension Sister     Review of Systems: The patient denies any heat or cold intolerance.  No  weight gain or weight loss.  The patient denies headaches or blurry vision.  There is no cough or sputum production.  The patient denies dizziness.  There is no hematuria or hematochezia.  The patient denies any muscle aches or arthritis.  The patient denies any rash.  The patient denies frequent falling or instability.  There is no history of depression or anxiety.  All other systems were reviewed and are negative.   Physical Exam: Filed Vitals:   08/04/13 1643  BP: 120/82  Pulse:    general appearance reveals a healthy-appearing woman in no acute distress.The head and neck exam reveals pupils equal and reactive.  Extraocular movements are full.  There is no scleral icterus.  The mouth and pharynx are normal.  The neck is supple.  The carotids reveal no bruits.  The jugular venous pressure is normal.  The  thyroid is not enlarged.  There is no lymphadenopathy.  The chest is clear to percussion and auscultation.  There are no rales or rhonchi.  Expansion of the chest is symmetrical.  The precordium is quiet.  The first heart sound is normal.  The second heart sound is physiologically split.  There is no murmur gallop rub or click.  There is no abnormal lift or heave.  The abdomen is soft and nontender.  The bowel sounds  are normal.  The liver and spleen are not enlarged.  There are no abdominal masses.  There are no abdominal bruits.  Extremities reveal good pedal pulses.  There is no phlebitis or edema.  There is no cyanosis or clubbing.  Strength is normal and symmetrical in all extremities.  There is no lateralizing weakness.  There are no sensory deficits.  The skin is warm and dry.  There is no rash.  EKG shows normal sinus rhythm and is within normal limits.  EKG today shows normal sinus rhythm and is within normal limits  Assessment / Plan: Overall the patient is doing well.  Her weight is down 1 pound since last visit.  She will continue same medication.  We'll plan to recheck her in one year  for office visit and fasting lipid panel hepatic function panel and basal metabolic panel.

## 2013-08-04 NOTE — Assessment & Plan Note (Signed)
Her blood pressure is generally normal although today diastolic initially was borderline high.  On recheck it was improved.

## 2013-09-14 ENCOUNTER — Other Ambulatory Visit: Payer: Self-pay | Admitting: Obstetrics and Gynecology

## 2013-09-14 DIAGNOSIS — N644 Mastodynia: Secondary | ICD-10-CM

## 2013-09-14 DIAGNOSIS — N6489 Other specified disorders of breast: Secondary | ICD-10-CM

## 2013-09-19 ENCOUNTER — Ambulatory Visit
Admission: RE | Admit: 2013-09-19 | Discharge: 2013-09-19 | Disposition: A | Discharge status: Home / Self Care | Payer: BC Managed Care – PPO | Source: Ambulatory Visit | Attending: Obstetrics and Gynecology | Admitting: Obstetrics and Gynecology

## 2013-09-19 ENCOUNTER — Other Ambulatory Visit: Payer: Self-pay | Admitting: Obstetrics and Gynecology

## 2013-09-19 DIAGNOSIS — N6489 Other specified disorders of breast: Secondary | ICD-10-CM

## 2013-09-19 DIAGNOSIS — N644 Mastodynia: Secondary | ICD-10-CM

## 2013-09-19 DIAGNOSIS — Z803 Family history of malignant neoplasm of breast: Secondary | ICD-10-CM

## 2013-10-13 ENCOUNTER — Other Ambulatory Visit: Payer: Self-pay

## 2013-10-13 DIAGNOSIS — E7800 Pure hypercholesterolemia, unspecified: Secondary | ICD-10-CM

## 2013-10-13 MED ORDER — ATORVASTATIN CALCIUM 20 MG PO TABS: 20.0000 mg | ORAL_TABLET | Freq: Every day | ORAL | Status: DC

## 2014-06-30 ENCOUNTER — Other Ambulatory Visit: Payer: Self-pay

## 2014-07-17 ENCOUNTER — Encounter: Payer: Self-pay | Admitting: Cardiology

## 2014-10-09 ENCOUNTER — Other Ambulatory Visit: Payer: Self-pay | Admitting: Obstetrics and Gynecology

## 2014-10-10 LAB — CYTOLOGY - PAP

## 2015-07-31 ENCOUNTER — Encounter: Payer: Self-pay | Admitting: Cardiology

## 2015-08-23 ENCOUNTER — Ambulatory Visit: Payer: BC Managed Care – PPO | Admitting: Cardiology

## 2015-10-15 ENCOUNTER — Ambulatory Visit (INDEPENDENT_AMBULATORY_CARE_PROVIDER_SITE_OTHER): Payer: BC Managed Care – PPO | Admitting: Cardiology

## 2015-10-15 ENCOUNTER — Encounter: Payer: Self-pay | Admitting: Cardiology

## 2015-10-15 VITALS — BP 116/70 | HR 70 | Ht 66.0 in | Wt 149.4 lb

## 2015-10-15 DIAGNOSIS — Z8249 Family history of ischemic heart disease and other diseases of the circulatory system: Secondary | ICD-10-CM | POA: Diagnosis not present

## 2015-10-15 DIAGNOSIS — E7800 Pure hypercholesterolemia, unspecified: Secondary | ICD-10-CM

## 2015-10-15 LAB — BASIC METABOLIC PANEL
BUN: 11 mg/dL (ref 7–25)
CHLORIDE: 105 mmol/L (ref 98–110)
CO2: 24 mmol/L (ref 20–31)
Calcium: 8.8 mg/dL (ref 8.6–10.2)
Creat: 0.78 mg/dL (ref 0.50–1.10)
Glucose, Bld: 83 mg/dL (ref 65–99)
POTASSIUM: 3.9 mmol/L (ref 3.5–5.3)
SODIUM: 139 mmol/L (ref 135–146)

## 2015-10-15 LAB — CBC WITH DIFFERENTIAL/PLATELET
Basophils Absolute: 0.1 10*3/uL (ref 0.0–0.1)
Basophils Relative: 1 % (ref 0–1)
Eosinophils Absolute: 0.2 10*3/uL (ref 0.0–0.7)
Eosinophils Relative: 3 % (ref 0–5)
HEMATOCRIT: 35.8 % — AB (ref 36.0–46.0)
Hemoglobin: 11.8 g/dL — ABNORMAL LOW (ref 12.0–15.0)
LYMPHS ABS: 2.1 10*3/uL (ref 0.7–4.0)
LYMPHS PCT: 36 % (ref 12–46)
MCH: 27.3 pg (ref 26.0–34.0)
MCHC: 33 g/dL (ref 30.0–36.0)
MCV: 82.9 fL (ref 78.0–100.0)
MONOS PCT: 12 % (ref 3–12)
MPV: 10.1 fL (ref 8.6–12.4)
Monocytes Absolute: 0.7 10*3/uL (ref 0.1–1.0)
NEUTROS ABS: 2.7 10*3/uL (ref 1.7–7.7)
NEUTROS PCT: 48 % (ref 43–77)
Platelets: 339 10*3/uL (ref 150–400)
RBC: 4.32 MIL/uL (ref 3.87–5.11)
RDW: 13 % (ref 11.5–15.5)
WBC: 5.7 10*3/uL (ref 4.0–10.5)

## 2015-10-15 LAB — LIPID PANEL
Cholesterol: 163 mg/dL (ref 125–200)
HDL: 46 mg/dL (ref >=46)
LDL CALC: 102 mg/dL (ref <130)
Total CHOL/HDL Ratio: 3.5 Ratio (ref <=5.0)
Triglycerides: 75 mg/dL (ref <150)
VLDL: 15 mg/dL (ref <30)

## 2015-10-15 LAB — HEPATIC FUNCTION PANEL
ALK PHOS: 40 U/L (ref 33–115)
ALT: 12 U/L (ref 6–29)
AST: 18 U/L (ref 10–35)
Albumin: 3.9 g/dL (ref 3.6–5.1)
BILIRUBIN INDIRECT: 0.4 mg/dL (ref 0.2–1.2)
Bilirubin, Direct: 0.1 mg/dL (ref <=0.2)
TOTAL PROTEIN: 6.9 g/dL (ref 6.1–8.1)
Total Bilirubin: 0.5 mg/dL (ref 0.2–1.2)

## 2015-10-15 MED ORDER — ROSUVASTATIN CALCIUM 10 MG PO TABS: 10.0000 mg | ORAL_TABLET | Freq: Every day | ORAL | Status: DC

## 2015-10-15 NOTE — Patient Instructions (Signed)
Medication Instructions:  STOP LIPITOR  START CRESTOR 10 MG ONE TABLET DAILY  Labwork: LP/BMET/HFP/CBC  Testing/Procedures: NONE  Follow-Up: Your physician wants you to follow-up in: 1 YEAR OV/LP/BMET/HFP/EKG WITH DR [place] will receive a reminder letter in the mail two months in advance. If you don't receive a letter, please call our office to schedule the follow-up appointment.  If you need a refill on your cardiac medications before your next appointment, please call your pharmacy.

## 2015-10-15 NOTE — Progress Notes (Signed)
Cardiology Office Note   Date:  10/15/2015   ID:  Kim, Lee 1966/01/09, MRN [id number]  PCP:  Laurey Morale, MD  Cardiologist: Darlin Coco MD  Chief Complaint  Patient presents with  . scheduled follow up      History of Present Illness: Kim Lee is a 50 y.o. female who presents for 1 year follow-up office visit   This pleasant 50 year old woman has a history of hypercholesterolemia. She does not have any history of ischemic heart disease and she had a normal treadmill Cardiolite stress test 07/31/09. She exercises regularly and does some jogging. In November 2016 she recently had any half marathon down in [place].  He plans to do another half marathon this spring.. She has not been experiencing any chest pain. . The patient has a history of hypercholesterolemia and is on Lipitor.  She has been experiencing myalgias in her legs which she attributes to the Lipitor.  We will switch her to Crestor.She has a past history of anemia.  She takes Flintstones chewable multivitamins.  She is on a birth control pill to help with her heavy periods.  Past Medical History  Diagnosis Date  . Hypercholesterolemia   . Atypical chest pain     History of   . Left arm pain     History of   . Left shoulder pain     History of   . Family history of premature CAD   . Angina     2 years ago.  1 episode,  Sees Dr. Mare Ferrari  . Hyperlipidemia     Past Surgical History  Procedure Laterality Date  . Tubal ligation  2001  . Mass excision  07/30/2011    Procedure: EXCISION MASS;  Surgeon: Wynonia Sours, MD;  Location: [facility];  Service: Orthopedics;  Laterality: Left;  left hand     Current Outpatient Prescriptions  Medication Sig Dispense Refill  . atorvastatin (LIPITOR) 20 MG tablet Take 1 tablet (20 mg total) by mouth daily. 90 tablet 2  . CRYSELLE-28 0.3-30 MG-MCG tablet Take 1 tablet by mouth daily.     No current facility-administered  medications for this visit.    Allergies:   Review of patient's allergies indicates no known allergies.    Social History:  The patient  reports that she quit smoking about 16 years ago. Her smoking use included Cigarettes. She has a 15 pack-year smoking history. She has never used smokeless tobacco. She reports that she does not drink alcohol or use illicit drugs.   Family History:  The patient's family history includes Cancer in her father; Coronary artery disease in her mother; Diabetes in her father; Hypertension in her mother and sister. her mother had multiple stents after age 50   ROS:  Please see the history of present illness.   Otherwise, review of systems are positive for none.   All other systems are reviewed and negative.    PHYSICAL EXAM: VS:  BP 116/70 mmHg  Pulse 70  Ht 5\' 6"  (1.676 m)  Wt 149 lb 6.4 oz (67.767 kg)  BMI 24.13 kg/m2 , BMI Body mass index is 24.13 kg/(m^2). GEN: Well nourished, well developed, in no acute distress HEENT: normal Neck: no JVD, carotid bruits, or masses Cardiac: RRR; no murmurs, rubs, or gallops,no edema  Respiratory:  clear to auscultation bilaterally, normal work of breathing GI: soft, nontender, nondistended, + BS MS: no deformity or atrophy Skin: warm and dry, no rash  Neuro:  Strength and sensation are intact Psych: euthymic mood, full affect   EKG:  EKG is ordered today. The ekg ordered today demonstrates normal sinus rhythm.  Within normal limits.   Recent Labs: No results found for requested labs within last 365 days.    Lipid Panel    Component Value Date/Time   CHOL 147 08/01/2013 0959   TRIG 65.0 08/01/2013 0959   HDL 53.20 08/01/2013 0959   CHOLHDL 3 08/01/2013 0959   VLDL 13.0 08/01/2013 0959   LDLCALC 81 08/01/2013 0959      Wt Readings from Last 3 Encounters:  10/15/15 149 lb 6.4 oz (67.767 kg)  08/04/13 144 lb (65.318 kg)  08/02/12 145 lb (65.772 kg)         ASSESSMENT AND PLAN:  1.   Hypercholesterolemia 2.  Family history of premature coronary disease 3.  Past history of iron deficiency anemia 4.  Leg pain from Lipitor Current medicines are reviewed at length with the patient today.  The patient does not have concerns regarding medicines.  The following changes have been made:  no change  Labs/ tests ordered today include:   Orders Placed This Encounter  Procedures  . Lipid panel  . Basic metabolic panel  . CBC with Differential/Platelet  . Hepatic function panel  . EKG 12-Lead    Disposition: We are going to switch her to Crestor.  She will return in one year for follow-up with Dr. Acie Fredrickson  Signed, Darlin Coco MD 10/15/2015 8:[address], [place], [place]  [address] Phone: [phone]; Fax: [phone]

## 2015-10-16 NOTE — Progress Notes (Signed)
Quick Note:  Please report to patient. The recent labs are stable. Continue same medication and careful diet. Hgb is stable 11.8. The LDL is only slightly high and she has not been taking lipitor regularly. She should reduce the crestor to just 5 mg a day to minimize chance of myalgia from crestor. ______

## 2015-10-17 ENCOUNTER — Other Ambulatory Visit: Payer: Self-pay | Admitting: *Deleted

## 2016-01-16 ENCOUNTER — Telehealth: Payer: Self-pay | Admitting: Cardiovascular Disease

## 2016-01-16 DIAGNOSIS — R079 Chest pain, unspecified: Secondary | ICD-10-CM

## 2016-01-16 NOTE — Telephone Encounter (Signed)
Spoke with pt about episode outlined below. She reports she was at work and seated at her desk. Room started spinning and fire department was called. She was given option of calling 911 or contacting her doctor.  Pt felt like she did not need to call 911.  At current time she is feeling better but not 100%. Feels a little antsy and shaken up after episode. Currently having mild left shoulder pain which started after episode today.  Pt reports it is mild and worse when she thinks about it.  She states she had left shoulder pain in the past and was told this was angina. This was around 2010. Stress test done at that time was OK.  Since then has had infrequent episodes of this pain. Usually once per year.  Does not occur with exercise. Blood pressure was not rechecked after readings below.  States blood pressure is not usually high.  She does not have a primary care provider. Will forward to Dr. Acie Fredrickson for review and recommendations.

## 2016-01-16 NOTE — Telephone Encounter (Signed)
I have reviewed notes from Dr. Mare Ferrari. She is a marathon runner. She has had intermittent episodes of chest pain and Dr. Mare Ferrari has thought in the past that she may be having angina. I suggest that we do a stress Myoview study. She'll need a follow-up appointment with either one of the extenders or me in the next week or so.

## 2016-01-16 NOTE — Telephone Encounter (Signed)
New Message  Pt called states that she recently had an episode. Dizzy spell. Heald to the deskl to keep from falling. Hot flash clamy feeling after that.  EMS was called. Firemen came to check BP  Pt c/o BP issue:  1. What are your last 5 BP readings?   Seated 168/104 Standing 140/100  Heart rate 90 O2 98  Blood sugar was 84   2. Are you having any other symptoms (ex. Dizziness, headache, blurred vision, passed out)? Dizziness, headache, Didn't pass out but it did get dark for about  1 min.   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

## 2016-01-16 NOTE — Telephone Encounter (Signed)
Spoke with patient and reviewed Dr. Elmarie Shiley advice with her.  She verbalized understanding and agreement to schedule exercise myoview.  I advised her that someone from our office will call her to schedule.  She asked about cost and I advised that once the test is scheduled, she can contact her insurance company for cost.  I advised that follow-up will be scheduled based on the results. I advised her to call the office with questions or concerns at any time. She verbalized understanding and agreement.

## 2016-01-17 ENCOUNTER — Telehealth: Payer: Self-pay | Admitting: Cardiology

## 2016-01-17 NOTE — Telephone Encounter (Signed)
Call this morning was transferred directly to triage. I spoke with the patient's husband initially this morning. He reports that the patient has been having shoulder and jaw pain/ tightness since yesterday afternoon. She did run a 5K on Saturday and did fine. Her BP is running "high" per the patient's husband, but no #'s given. They are requesting an appointment to be seen today. I inquired of the patient's husband, if the patient felt she could wait to be seen. He states that seeing her this morning would be ideal as they are attending a funeral this afternoon. I advised the patient's husband if she is actively having symptoms, then she needs to go to the ER/ Urgent Care.  Per the patient's husband, they are not going to the ER. I advised him I would need to speak with Dr. Meda Coffee (DOD) today and call them back.  Reviewed the patient's symptoms and Dr. Elmarie Shiley phone note from yesterday with Dr. Meda Coffee. Per Dr. Meda Coffee, if the patient is actively having symptoms, she needs to report to the ER. I called back and spoke directly with the patient. I have advised her that Dr. Francesca Oman recommendation is for her to report to the ER for evaluation of symptoms. Per the patient, she states "I'm not sure if they are real or in my head."  I advised her if she does not report to the ER, then she should go to Urgent Care for evaluaton. She did not confirm/ deny if she would go to the ER to be seen.  She said "Thank you" and hung up.

## 2016-01-17 NOTE — Telephone Encounter (Signed)
Per pt's husband call right she is having issues today jaw hurting left shoulder pain. Asked if pt had PCP none.   Pt would like to be seen today. [employer].

## 2016-01-22 ENCOUNTER — Telehealth: Payer: Self-pay | Admitting: Cardiovascular Disease

## 2016-01-22 ENCOUNTER — Ambulatory Visit (INDEPENDENT_AMBULATORY_CARE_PROVIDER_SITE_OTHER): Payer: BC Managed Care – PPO | Admitting: Internal Medicine

## 2016-01-22 ENCOUNTER — Encounter: Payer: Self-pay | Admitting: Internal Medicine

## 2016-01-22 VITALS — BP 130/86 | HR 74 | Temp 98.6°F | Ht 66.0 in | Wt 139.8 lb

## 2016-01-22 DIAGNOSIS — R42 Dizziness and giddiness: Secondary | ICD-10-CM

## 2016-01-22 DIAGNOSIS — E785 Hyperlipidemia, unspecified: Secondary | ICD-10-CM | POA: Diagnosis not present

## 2016-01-22 DIAGNOSIS — L659 Nonscarring hair loss, unspecified: Secondary | ICD-10-CM | POA: Diagnosis not present

## 2016-01-22 LAB — TSH: TSH: 1.58 u[IU]/mL (ref 0.35–4.50)

## 2016-01-22 NOTE — Progress Notes (Signed)
Pre visit review using our clinic review tool, if applicable. No additional management support is needed unless otherwise documented below in the visit note. 

## 2016-01-22 NOTE — Telephone Encounter (Signed)
Pt is scheduled for office visit with Dr. Acie Fredrickson on 02/15/16

## 2016-01-22 NOTE — Patient Instructions (Signed)

## 2016-01-22 NOTE — Telephone Encounter (Signed)
Patient was schedule for Myoview on 01-24-16.  She called and cancel test on 5/5.  She stated that she  wants to cancel and she would rather just have an ov with Dr.Nasher per Tillie Rung note.

## 2016-01-22 NOTE — Assessment & Plan Note (Signed)
Encouraged her to consume a low fat diet Lipid Profile from 09/2015 reviewed Advised her to take Crestor and baby ASA daily

## 2016-01-22 NOTE — Progress Notes (Addendum)
HPI  Pt presents to the clinic today to establish care and for management of the conditions listed below. She is transferring care from Dr. Maudie Mercury, who she has not seen in  >3 years.  Flu: 2013 Tetanus: not sure Pap Smear: 10/2015 Mammogram: 10/2015 Vision Screening: yearly Dentist: yearly  HLD: She is prescribed Crestor, but reports she only takes it when she remembers, maybe a few times per week. She does try to consume a low fat diet. Her last LDL was 102, 09/2015.  She also reports that she recently had a dizzy spell. She was sitting down at work and all of a sudden felt like she was going to fall over. She denied headache, blurred vision, chest pain or shortness of breath with this episode. She did go to the ER for the same and reports her BP was elevated (but can not tell me the exact number). No notes to review. She reports she has intermittently been dizzy since that time. It does not occur with position changes or head movement. She denies any sinus or allergy complaint. She does drink some water but reports she could drink more. She denies any overt signs of bleeding. She denies recent changes in diet, medications or exercise.  She also c/o pain in her upper abdomen and  under her left shoulder blade. This has been intermittent over the last few years. She describes the pain as discomfort. It seems worse after she drinks coffee. She denies chest pain or shortness of breath. She is not sure if it is reflux. It gets better when she stops thinking about makes. Her bowels are moving normally. She has taken Prilosec in th past for the same but not sure if it helped.  Past Medical History  Diagnosis Date  . Hypercholesterolemia   . Atypical chest pain     History of   . Left arm pain     History of   . Left shoulder pain     History of   . Family history of premature CAD   . Angina     2 years ago.  1 episode,  Sees Dr. Mare Ferrari  . Hyperlipidemia     Current Outpatient Prescriptions   Medication Sig Dispense Refill  . aspirin 81 MG tablet Take 81 mg by mouth daily.    . CRYSELLE-28 0.3-30 MG-MCG tablet Take 1 tablet by mouth daily.    . rosuvastatin (CRESTOR) 10 MG tablet Take 1/2 tablet (5 mg) daily 90 tablet 3   No current facility-administered medications for this visit.    Allergies  Allergen Reactions  . Lipitor [Atorvastatin] Anaphylaxis    LEG PAIN     Family History  Problem Relation Age of Onset  . Diabetes Father   . Cancer Father     pancreatic  . Coronary artery disease Mother     with stents  . Hypertension Mother   . Cancer Mother     lung  . Hypertension Sister   . Cancer Sister     breast  . Cancer Sister     leukemia    Social History   Social History  . Marital Status: Married    Spouse Name: N/A  . Number of Children: N/A  . Years of Education: N/A   Occupational History  . Not on file.   Social History Main Topics  . Smoking status: Former Smoker -- 1.00 packs/day for 15 years    Types: Cigarettes    Quit date: 05/05/1999  .  Smokeless tobacco: Never Used  . Alcohol Use: 0.0 oz/week    0 Standard drinks or equivalent per week     Comment: very rarely  . Drug Use: No  . Sexual Activity: Yes   Other Topics Concern  . Not on file   Social History Narrative    ROS:  Constitutional: Denies fever, malaise, fatigue, headache or abrupt weight changes.  HEENT: Denies eye pain, eye redness, ear pain, ringing in the ears, wax buildup, runny nose, nasal congestion, bloody nose, or sore throat. Respiratory: Denies difficulty breathing, shortness of breath, cough or sputum production.   Cardiovascular: Denies chest pain, chest tightness, palpitations or swelling in the hands or feet.  Gastrointestinal: Pt reports abdominal pain. Denies bloating, constipation, diarrhea or blood in the stool.  GU: Denies frequency, urgency, pain with urination, blood in urine, odor or discharge. Musculoskeletal: Pt reports left sided back  pain. Denies decrease in range of motion, difficulty with gait, muscle pain or joint pain and swelling.  Skin: Pt reports hair loss. Denies redness, rashes, lesions or ulcercations.  Neurological: Pt reports dizziness. Denies difficulty with memory, difficulty with speech or problems with balance and coordination.  Psych: Denies anxiety, depression, SI/HI.  No other specific complaints in a complete review of systems (except as listed in HPI above).  PE:  BP 130/86 mmHg  Pulse 74  Temp(Src) 98.6 F (37 C) (Oral)  Ht '5\' 6"'  (1.676 m)  Wt 139 lb 12 oz (63.39 kg)  BMI 22.57 kg/m2  SpO2 98%  LMP 01/04/2016 Wt Readings from Last 3 Encounters:  01/22/16 139 lb 12 oz (63.39 kg)  10/15/15 149 lb 6.4 oz (67.767 kg)  08/04/13 144 lb (65.318 kg)    General: Appears her stated age, well developed, well nourished in NAD. HEENT: Head: normal shape and size; Eyes: sclera white, no icterus, conjunctiva pink, PERRLA and EOMs intact, no nystagmus noted; Ears: Tm's gray and intact, normal light reflex; Neck: Neck supple, trachea midline. No masses, lumps or thyromegaly present.  Cardiovascular: Normal rate and rhythm. S1,S2 noted.  No murmur, rubs or gallops noted.  Pulmonary/Chest: Normal effort and positive vesicular breath sounds. No respiratory distress. No wheezes, rales or ronchi noted.  Abdomen: Soft and nontender. Normal bowel sounds. Musculoskeletal: Back pain not reproduced with palpation. Neurological: Alert and oriented. Coordination normal.  Psychiatric: She seems mildly anxious today. Judgement and thought content is normal.  BMET    Component Value Date/Time   NA 139 10/15/2015 0831   K 3.9 10/15/2015 0831   CL 105 10/15/2015 0831   CO2 24 10/15/2015 0831   GLUCOSE 83 10/15/2015 0831   BUN 11 10/15/2015 0831   CREATININE 0.78 10/15/2015 0831   CREATININE 0.7 08/01/2013 0959   CALCIUM 8.8 10/15/2015 0831   GFRNONAA >60 07/21/2009 1815   GFRAA  07/21/2009 1815    >60         The eGFR has been calculated using the MDRD equation. This calculation has not been validated in all clinical situations. eGFR's persistently <60 mL/min signify possible Chronic Kidney Disease.    Lipid Panel     Component Value Date/Time   CHOL 163 10/15/2015 0831   TRIG 75 10/15/2015 0831   HDL 46 10/15/2015 0831   CHOLHDL 3.5 10/15/2015 0831   VLDL 15 10/15/2015 0831   LDLCALC 102 10/15/2015 0831    CBC    Component Value Date/Time   WBC 5.7 10/15/2015 0831   RBC 4.32 10/15/2015 0831   RBC 4.31  07/20/2012 1133   HGB 11.8* 10/15/2015 0831   HCT 35.8* 10/15/2015 0831   PLT 339 10/15/2015 0831   MCV 82.9 10/15/2015 0831   MCH 27.3 10/15/2015 0831   MCHC 33.0 10/15/2015 0831   RDW 13.0 10/15/2015 0831   LYMPHSABS 2.1 10/15/2015 0831   MONOABS 0.7 10/15/2015 0831   EOSABS 0.2 10/15/2015 0831   BASOSABS 0.1 10/15/2015 0831    Hgb A1C No results found for: HGBA1C   Assessment and Plan:  Dizziness, hair loss:  Exam benign BP is fine today She has had a workup by cardiologist in the past, no heart issues Advised her to increase her fluid intake Will check TSH today  Left upper abdominal pain, left upper back pain:  Sounds like reflux Encouraged her to avoid caffeine, alcohol, spicy foods, etc She will start Prilosec OTC daily x 2 weeks  Follow up in 2 weeks, sooner if not improved

## 2016-01-24 ENCOUNTER — Encounter (HOSPITAL_COMMUNITY): Payer: BC Managed Care – PPO

## 2016-02-15 ENCOUNTER — Encounter: Payer: Self-pay | Admitting: Cardiovascular Disease

## 2016-02-15 ENCOUNTER — Ambulatory Visit (INDEPENDENT_AMBULATORY_CARE_PROVIDER_SITE_OTHER): Payer: BC Managed Care – PPO | Admitting: Cardiovascular Disease

## 2016-02-15 VITALS — BP 110/82 | HR 64 | Ht 66.0 in | Wt 142.8 lb

## 2016-02-15 DIAGNOSIS — E785 Hyperlipidemia, unspecified: Secondary | ICD-10-CM | POA: Diagnosis not present

## 2016-02-15 LAB — LIPID PANEL
Cholesterol: 194 mg/dL (ref 125–200)
HDL: 55 mg/dL (ref >=46)
LDL CALC: 121 mg/dL (ref <130)
TRIGLYCERIDES: 91 mg/dL (ref <150)
Total CHOL/HDL Ratio: 3.5 Ratio (ref <=5.0)
VLDL: 18 mg/dL (ref <30)

## 2016-02-15 LAB — COMPREHENSIVE METABOLIC PANEL
ALT: 12 U/L (ref 6–29)
AST: 14 U/L (ref 10–35)
Albumin: 4 g/dL (ref 3.6–5.1)
Alkaline Phosphatase: 38 U/L (ref 33–115)
BUN: 8 mg/dL (ref 7–25)
CHLORIDE: 104 mmol/L (ref 98–110)
CO2: 26 mmol/L (ref 20–31)
CREATININE: 0.77 mg/dL (ref 0.50–1.10)
Calcium: 8.5 mg/dL — ABNORMAL LOW (ref 8.6–10.2)
GLUCOSE: 77 mg/dL (ref 65–99)
POTASSIUM: 4 mmol/L (ref 3.5–5.3)
SODIUM: 139 mmol/L (ref 135–146)
TOTAL PROTEIN: 6.8 g/dL (ref 6.1–8.1)
Total Bilirubin: 0.3 mg/dL (ref 0.2–1.2)

## 2016-02-15 NOTE — Progress Notes (Signed)
Cardiology Office Note   Date:  02/15/2016   ID:  Kim, Lee [birth year], MRN [id number]  PCP:  Webb Silversmith, NP  Cardiologist: formerly Darlin Coco MD, now [facility]   Chief Complaint  Patient presents with  . Hyperlipidemia      History of Present Illness: Kim Lee is a 50 y.o. female who presents for 1 year follow-up office visit   This pleasant 50 year old woman has a history of hypercholesterolemia. She does not have any history of ischemic heart disease and she had a normal treadmill Cardiolite stress test 07/31/09. She exercises regularly and does some jogging. In November 2016 she recently had any half marathon down in [place].  He plans to do another half marathon this spring.. She has not been experiencing any chest pain. . The patient has a history of hypercholesterolemia and is on Lipitor.  She has been experiencing myalgias in her legs which she attributes to the Lipitor.  We will switch her to Crestor.She has a past history of anemia.  She takes Flintstones chewable multivitamins.  She is on a birth control pill to help with her heavy periods.  February 15, 2016:  Kim Lee is seen for the first time  - transfer from Dr. Mare Ferrari. She was changed from atorvastatin to Crestor last year. She was having lots of leg pain with the atorvastatin. She still has some leg pain although it seems to be tolerable. She does comment that the Crestor causes her to wake up every night at 3 AM. She's requested changing it to a morning dose.  Still running  - doing a [employer] run in [employer] .   She had a dizzy spell several weeks ago.  Went to [facility] the next day .  Work up was normal .   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 Hypercholesterolemia   . Atypical chest pain     History of   . Left arm pain     History of   . Left shoulder  pain     History of   . Family history of premature CAD   . Angina     2 years ago.  1 episode,  Sees Dr. Mare Ferrari  . Hyperlipidemia     Past Surgical History  Procedure Laterality Date  . Tubal ligation  2001  . Mass excision  07/30/2011    Procedure: EXCISION MASS;  Surgeon: Wynonia Sours, MD;  Location: [place] [facility];  Service: Orthopedics;  Laterality: Left;  left hand     Current Outpatient Prescriptions  Medication Sig Dispense Refill  . aspirin 81 MG tablet Take 81 mg by mouth daily.    . CRYSELLE-28 0.3-30 MG-MCG tablet Take 1 tablet by mouth daily.    . rosuvastatin (CRESTOR) 10 MG tablet Take 1/2 tablet (5 mg) daily 90 tablet 3   No current facility-administered medications for this visit.    Allergies:   Lipitor    Social History:  The patient  reports that she quit smoking about 16 years ago. Her smoking use included Cigarettes. She has a 15 pack-year smoking history. She has never used smokeless tobacco. She reports that she drinks alcohol. She reports that she does not use illicit drugs.   Family History:  The patient's family history includes Cancer in her father, mother, sister, and sister; Coronary artery  disease in her mother; Diabetes in her father; Hypertension in her mother and sister. her mother had multiple stents after age 66   ROS:  Please see the history of present illness.   Otherwise, review of systems are positive for none.   All other systems are reviewed and negative.    PHYSICAL EXAM: VS:  BP 110/82 mmHg  Pulse 64  Ht 5\' 6"  (1.676 m)  Wt 142 lb 12.8 oz (64.774 kg)  BMI 23.06 kg/m2  LMP 01/04/2016 , BMI Body mass index is 23.06 kg/(m^2). GEN: Well nourished, well developed, in no acute distress HEENT: normal Neck: no JVD, carotid bruits, or masses Cardiac: RRR; no murmurs, rubs, or gallops,no edema  Respiratory:  clear to auscultation bilaterally, normal work of breathing GI: soft, nontender, nondistended, + BS MS: no  deformity or atrophy Skin: warm and dry, no rash Neuro:  Strength and sensation are intact Psych: euthymic mood, full affect   EKG:  EKG is ordered today. The ekg ordered today demonstrates normal sinus rhythm.  Within normal limits.   Recent Labs: 10/15/2015: ALT 12; BUN 11; Creat 0.78; Hemoglobin 11.8*; Platelets 339; Potassium 3.9; Sodium 139 01/22/2016: TSH 1.58    Lipid Panel    Component Value Date/Time   CHOL 163 10/15/2015 0831   TRIG 75 10/15/2015 0831   HDL 46 10/15/2015 0831   CHOLHDL 3.5 10/15/2015 0831   VLDL 15 10/15/2015 0831   LDLCALC 102 10/15/2015 0831      Wt Readings from Last 3 Encounters:  02/15/16 142 lb 12.8 oz (64.774 kg)  01/22/16 139 lb 12 oz (63.39 kg)  10/15/15 149 lb 6.4 oz (67.767 kg)         ASSESSMENT AND PLAN:  1.  Hypercholesterolemia - continue crestor ,  Seems to be tolerating the Crestor  better than the Atorvastatin .   Will check labs today   2.  Family history of premature coronary disease  3.  Past history of iron deficiency anemia    Current medicines are reviewed at length with the patient today.  The patient does not have concerns regarding medicines.  The following changes have been made:  no change  Labs/ tests ordered today include:   No orders of the defined types were placed in this encounter.     Mertie Moores, MD  02/15/2016 4:10 PM    [facility] [place],  [place], [place]  [address] Pager [phone] Phone: [phone]; Fax: [phone]

## 2016-02-15 NOTE — Patient Instructions (Signed)
Medication Instructions:  Your physician recommends that you continue on your current medications as directed. Please refer to the Current Medication list given to you today.   Labwork: TODAY - complete metabolic panel, cholesterol   Testing/Procedures: None Ordered   Follow-Up: Your physician wants you to follow-up in: 1 year with Dr. Nahser. You will receive a reminder letter in the mail two months in advance. If you don't receive a letter, please call our office to schedule the follow-up appointment.   If you need a refill on your cardiac medications before your next appointment, please call your pharmacy.   Thank you for choosing [facility]! Anjanette Gilkey, RN [phone]    

## 2016-02-18 ENCOUNTER — Telehealth: Payer: Self-pay | Admitting: Nurse Practitioner

## 2016-02-18 DIAGNOSIS — E785 Hyperlipidemia, unspecified: Secondary | ICD-10-CM

## 2016-02-18 MED ORDER — EZETIMIBE 10 MG PO TABS: 10.0000 mg | ORAL_TABLET | Freq: Every day | ORAL | Status: DC

## 2016-02-18 NOTE — Telephone Encounter (Signed)
-----   Message from Thayer Headings, MD sent at 02/17/2016  4:00 PM EDT ----- She only tolerates low dose crestor.   LDL goal is between 70-100. Lets add zetia 10 mg a day . Recheck fasting labs in 3 months

## 2016-02-18 NOTE — Telephone Encounter (Signed)
Reviewed lab results and plan of care with patient who verbalized understanding and agreement.  Lab appointment scheduled for 9/6 and patient advised to call back with questions or concerns.

## 2016-05-21 ENCOUNTER — Other Ambulatory Visit: Payer: BC Managed Care – PPO

## 2016-06-04 ENCOUNTER — Other Ambulatory Visit: Payer: BC Managed Care – PPO | Admitting: *Deleted

## 2016-06-04 DIAGNOSIS — E785 Hyperlipidemia, unspecified: Secondary | ICD-10-CM

## 2016-06-04 LAB — LIPID PANEL
CHOLESTEROL: 157 mg/dL (ref 125–200)
HDL: 58 mg/dL (ref >=46)
LDL Cholesterol: 77 mg/dL (ref <130)
TRIGLYCERIDES: 112 mg/dL (ref <150)
Total CHOL/HDL Ratio: 2.7 Ratio (ref <=5.0)
VLDL: 22 mg/dL (ref <30)

## 2016-06-04 LAB — COMPREHENSIVE METABOLIC PANEL
ALBUMIN: 3.9 g/dL (ref 3.6–5.1)
ALT: 12 U/L (ref 6–29)
AST: 14 U/L (ref 10–35)
Alkaline Phosphatase: 36 U/L (ref 33–130)
BUN: 11 mg/dL (ref 7–25)
CALCIUM: 8.7 mg/dL (ref 8.6–10.4)
CHLORIDE: 106 mmol/L (ref 98–110)
CO2: 25 mmol/L (ref 20–31)
CREATININE: 0.77 mg/dL (ref 0.50–1.05)
Glucose, Bld: 84 mg/dL (ref 65–99)
Potassium: 4.3 mmol/L (ref 3.5–5.3)
SODIUM: 140 mmol/L (ref 135–146)
TOTAL PROTEIN: 6.7 g/dL (ref 6.1–8.1)
Total Bilirubin: 0.4 mg/dL (ref 0.2–1.2)

## 2016-06-05 ENCOUNTER — Telehealth: Payer: Self-pay | Admitting: Cardiovascular Disease

## 2016-06-05 ENCOUNTER — Telehealth: Payer: Self-pay

## 2016-06-05 NOTE — Telephone Encounter (Signed)
New Message ° °Pt voiced she is returning nurses call. ° °Please f/u °

## 2016-06-05 NOTE — Telephone Encounter (Signed)
spoke with pt about recent lab results. pt verbalized understanding and will continue with her current treatment plan.

## 2016-06-05 NOTE — Telephone Encounter (Signed)
See telephone encounter dated 06/05/16 by C. Corinna Capra, CMA

## 2016-09-15 DIAGNOSIS — E069 Thyroiditis, unspecified: Secondary | ICD-10-CM

## 2016-09-15 HISTORY — DX: Thyroiditis, unspecified: E06.9

## 2017-04-01 ENCOUNTER — Other Ambulatory Visit: Payer: Self-pay | Admitting: Cardiovascular Disease

## 2017-05-20 ENCOUNTER — Encounter: Payer: Self-pay | Admitting: Cardiovascular Disease

## 2017-05-20 ENCOUNTER — Ambulatory Visit (INDEPENDENT_AMBULATORY_CARE_PROVIDER_SITE_OTHER): Payer: BC Managed Care – PPO | Admitting: Cardiovascular Disease

## 2017-05-20 VITALS — BP 140/80 | HR 72 | Ht 66.0 in | Wt 155.6 lb

## 2017-05-20 DIAGNOSIS — Z8249 Family history of ischemic heart disease and other diseases of the circulatory system: Secondary | ICD-10-CM

## 2017-05-20 DIAGNOSIS — E782 Mixed hyperlipidemia: Secondary | ICD-10-CM | POA: Diagnosis not present

## 2017-05-20 NOTE — Progress Notes (Signed)
Cardiology Office Note   Date:  05/20/2017   ID:  Shrita, Thien Mar 21, 1966, MRN [id number]  PCP:  Jearld Fenton, NP  Cardiologist: formerly Darlin Coco MD, now [place]   Chief Complaint  Patient presents with  . Follow-up    hyperlipidemia       History of Present Illness: Kim Lee is a 51 y.o. female who presents for 1 year follow-up office visit   This pleasant 51 year old woman has a history of hypercholesterolemia. She does not have any history of ischemic heart disease and she had a normal treadmill Cardiolite stress test 07/31/09. She exercises regularly and does some jogging. In November 2016 she recently had any half marathon down in [place].  He plans to do another half marathon this spring.. She has not been experiencing any chest pain. . The patient has a history of hypercholesterolemia and is on Lipitor.  She has been experiencing myalgias in her legs which she attributes to the Lipitor.  We will switch her to Crestor.She has a past history of anemia.  She takes Flintstones chewable multivitamins.  She is on a birth control pill to help with her heavy periods.  February 15, 2016:  Johnella is seen for the first time  - transfer from Dr. Mare Ferrari. She was changed from atorvastatin to Crestor last year. She was having lots of leg pain with the atorvastatin. She still has some leg pain although it seems to be tolerable. She does comment that the Crestor causes her to wake up every night at 3 AM. She's requested changing it to a morning dose.  Still running  - doing a [employer] run in [employer] [place] .   She had a dizzy spell several weeks ago.  Went to [facility] the next day .  Work up was normal .   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 5, 2018:  Rami is seen For follow-up of her hyperlipidemia. No CP or dyspnea.  Still runs regularly  - doing a 1/2 marathon in  several weeks .   Past Medical History:  Diagnosis Date  . Angina    2 years ago.  1 episode,  Sees Dr. Mare Ferrari  . Atypical chest pain    History of   . Family history of premature CAD   . Hypercholesterolemia   . Hyperlipidemia   . Left arm pain    History of   . Left shoulder pain    History of     Past Surgical History:  Procedure Laterality Date  . MASS EXCISION  07/30/2011   Procedure: EXCISION MASS;  Surgeon: Wynonia Sours, MD;  Location: [facility];  Service: Orthopedics;  Laterality: Left;  left hand  . TUBAL LIGATION  2001     Current Outpatient Prescriptions  Medication Sig Dispense Refill  . aspirin 81 MG tablet Take 81 mg by mouth daily.    . CRYSELLE-28 0.3-30 MG-MCG tablet Take 1 tablet by mouth daily.    Marland Kitchen ezetimibe (ZETIA) 10 MG tablet TAKE 1 TABLET BY MOUTH DAILY 30 tablet 2  . rosuvastatin (CRESTOR) 10 MG tablet Take 5 mg by mouth daily. Take 1/2 tablet (5 mg) daily 90 tablet 3   No current facility-administered medications for this visit.     Allergies:   Lipitor [atorvastatin]    Social History:  The patient  reports that she quit smoking  about 18 years ago. Her smoking use included Cigarettes. She has a 15.00 pack-year smoking history. She has never used smokeless tobacco. She reports that she drinks alcohol. She reports that she does not use drugs.   Family History:  The patient's family history includes Cancer in her father, mother, sister, and sister; Coronary artery disease in her mother; Diabetes in her father; Hypertension in her mother and sister. her mother had multiple stents after age 69   ROS:  Please see the history of present illness.   Otherwise, review of systems are positive for none.   All other systems are reviewed and negative.    PHYSICAL EXAM: VS:  BP 140/80   Pulse 72   Ht 5\' 6"  (1.676 m)   Wt 155 lb 9.6 oz (70.6 kg)   BMI 25.11 kg/m  , BMI Body mass index is 25.11 kg/m. GEN: Well nourished, well  developed, in no acute distress  HEENT: normal  Neck: no JVD, carotid bruits, or masses Cardiac: RRR; no murmurs, rubs, or gallops,no edema  Respiratory:  clear to auscultation bilaterally, normal work of breathing GI: soft, nontender, nondistended, + BS MS: no deformity or atrophy  Skin: warm and dry, no rash Neuro:  Strength and sensation are intact Psych: euthymic mood, full affect   EKG:  EKG is ordered today. Sept. 5, 2018:   NSR at 72.  Normal ECG  Recent Labs: 06/04/2016: ALT 12; BUN 11; Creat 0.77; Potassium 4.3; Sodium 140    Lipid Panel    Component Value Date/Time   CHOL 157 06/04/2016 0734   TRIG 112 06/04/2016 0734   HDL 58 06/04/2016 0734   CHOLHDL 2.7 06/04/2016 0734   VLDL 22 06/04/2016 0734   LDLCALC 77 06/04/2016 0734      Wt Readings from Last 3 Encounters:  05/20/17 155 lb 9.6 oz (70.6 kg)  02/15/16 142 lb 12.8 oz (64.8 kg)  01/22/16 139 lb 12 oz (63.4 kg)         ASSESSMENT AND PLAN:  1.  Hypercholesterolemia - continue crestor and Zetia Check labs today  Will see her in 1 year  Still very active.  Runs regularly   2.  Family history of premature coronary disease  3.  Past history of iron deficiency anemia    Current medicines are reviewed at length with the patient today.  The patient does not have concerns regarding medicines.  The following changes have been made:  no change  Labs/ tests ordered today include:   No orders of the defined types were placed in this encounter.    Mertie Moores, MD  05/20/2017 4:19 PM    [facility] [place],  [place], [place]  [address] Pager [phone] Phone: [phone]; Fax: [phone]

## 2017-05-20 NOTE — Patient Instructions (Addendum)
Medication Instructions:  Your physician recommends that you continue on your current medications as directed. Please refer to the Current Medication list given to you today.   Labwork: Fasting labs:cholesterol, basic metabolic panel, liver panel on Tuesday Sept. 11   Testing/Procedures: None Ordered   Follow-Up: Your physician wants you to follow-up in: 1 year with Dr. Acie Fredrickson.  You will receive a reminder letter in the mail two months in advance. If you don't receive a letter, please call our office to schedule the follow-up appointment.   If you need a refill on your cardiac medications before your next appointment, please call your pharmacy.   Thank you for choosing [facility]! Christen Bame, RN [phone]

## 2017-05-26 ENCOUNTER — Other Ambulatory Visit: Payer: BC Managed Care – PPO

## 2017-06-03 ENCOUNTER — Other Ambulatory Visit: Payer: BC Managed Care – PPO | Admitting: *Deleted

## 2017-06-03 DIAGNOSIS — E782 Mixed hyperlipidemia: Secondary | ICD-10-CM

## 2017-06-03 DIAGNOSIS — Z8249 Family history of ischemic heart disease and other diseases of the circulatory system: Secondary | ICD-10-CM

## 2017-06-03 LAB — BASIC METABOLIC PANEL
BUN/Creatinine Ratio: 13 (ref 9–23)
BUN: 10 mg/dL (ref 6–24)
CALCIUM: 9 mg/dL (ref 8.7–10.2)
CHLORIDE: 105 mmol/L (ref 96–106)
CO2: 23 mmol/L (ref 20–29)
Creatinine, Ser: 0.78 mg/dL (ref 0.57–1.00)
GFR calc non Af Amer: 88 mL/min/{1.73_m2} (ref >59)
GFR, EST AFRICAN AMERICAN: 102 mL/min/{1.73_m2} (ref >59)
GLUCOSE: 87 mg/dL (ref 65–99)
POTASSIUM: 4.3 mmol/L (ref 3.5–5.2)
Sodium: 141 mmol/L (ref 134–144)

## 2017-06-03 LAB — HEPATIC FUNCTION PANEL
ALT: 16 IU/L (ref 0–32)
AST: 18 IU/L (ref 0–40)
Albumin: 4.5 g/dL (ref 3.5–5.5)
Alkaline Phosphatase: 53 IU/L (ref 39–117)
BILIRUBIN TOTAL: 0.4 mg/dL (ref 0.0–1.2)
BILIRUBIN, DIRECT: 0.11 mg/dL (ref 0.00–0.40)
TOTAL PROTEIN: 7 g/dL (ref 6.0–8.5)

## 2017-06-03 LAB — LIPID PANEL
CHOL/HDL RATIO: 2.5 ratio (ref 0.0–4.4)
Cholesterol, Total: 147 mg/dL (ref 100–199)
HDL: 59 mg/dL (ref >39)
LDL CALC: 72 mg/dL (ref 0–99)
TRIGLYCERIDES: 82 mg/dL (ref 0–149)
VLDL Cholesterol Cal: 16 mg/dL (ref 5–40)

## 2017-06-05 ENCOUNTER — Encounter: Payer: Self-pay | Admitting: *Deleted

## 2017-06-05 ENCOUNTER — Ambulatory Visit (INDEPENDENT_AMBULATORY_CARE_PROVIDER_SITE_OTHER): Payer: BC Managed Care – PPO | Admitting: Internal Medicine

## 2017-06-05 ENCOUNTER — Telehealth: Payer: Self-pay | Admitting: *Deleted

## 2017-06-05 ENCOUNTER — Encounter: Payer: Self-pay | Admitting: Internal Medicine

## 2017-06-05 VITALS — BP 122/82 | HR 74 | Temp 98.1°F | Wt 154.0 lb

## 2017-06-05 DIAGNOSIS — M67432 Ganglion, left wrist: Secondary | ICD-10-CM

## 2017-06-05 DIAGNOSIS — J069 Acute upper respiratory infection, unspecified: Secondary | ICD-10-CM

## 2017-06-05 DIAGNOSIS — B9789 Other viral agents as the cause of diseases classified elsewhere: Secondary | ICD-10-CM | POA: Diagnosis not present

## 2017-06-05 NOTE — Telephone Encounter (Signed)
-----   Message from Thayer Headings, MD sent at 06/04/2017  1:48 PM EDT ----- Labs are stable

## 2017-06-05 NOTE — Progress Notes (Signed)
HPI  Pt presents to the clinic today with c/o cough and chest congestion. She reports this started 1 week ago. The cough is productive of white mucous. She reports a burning sensation in her chest when she cough, but denies wheezing or shortness of breath. She denies runny nose, nasal congestion or ear pain. She denies fever, chills or body aches. She has tried Mucinex, Sudafed and Robitussin with some relief. She has no history of allergies that she is aware of. She has not had sick contacts that she is aware of but she does work at a school.   She also c/o a cyst on her left wrist. She noticed this 1 week ago. The cyst has grown in size and is painful. She has not noticed any drainage from the area. She has not taken anything OTC for this.  Review of Systems        Past Medical History:  Diagnosis Date  . Angina    2 years ago.  1 episode,  Sees Dr. Mare Ferrari  . Atypical chest pain    History of   . Family history of premature CAD   . Hypercholesterolemia   . Hyperlipidemia   . Left arm pain    History of   . Left shoulder pain    History of     Family History  Problem Relation Age of Onset  . Diabetes Father   . Cancer Father        pancreatic  . Coronary artery disease Mother        with stents  . Hypertension Mother   . Cancer Mother        lung  . Cancer Sister        breast  . Cancer Sister        leukemia  . Hypertension Sister     Social History   Social History  . Marital status: Married    Spouse name: N/A  . Number of children: N/A  . Years of education: N/A   Occupational History  . Not on file.   Social History Main Topics  . Smoking status: Former Smoker    Packs/day: 1.00    Years: 15.00    Types: Cigarettes    Quit date: 05/05/1999  . Smokeless tobacco: Never Used  . Alcohol use 0.0 oz/week     Comment: very rarely  . Drug use: No  . Sexual activity: Yes   Other Topics Concern  . Not on file   Social History Narrative  . No  narrative on file    Allergies  Allergen Reactions  . Lipitor [Atorvastatin] Anaphylaxis    LEG PAIN      Constitutional: Denies headache, fatigue, fever or abrupt weight changes.  HEENT:  Positive sore throat. Denies eye redness, eye pain, pressure behind the eyes, facial pain, nasal congestion, ear pain, ringing in the ears, wax buildup, runny nose or bloody nose. Respiratory: Positive cough. Denies difficulty breathing or shortness of breath.  Cardiovascular: Denies chest pain, chest tightness, palpitations or swelling in the hands or feet.  Skin: Pt reports cyst of left wrist.  No other specific complaints in a complete review of systems (except as listed in HPI above).  Objective:   BP 122/82   Pulse 74   Temp 98.1 F (36.7 C) (Oral)   Wt 154 lb (69.9 kg)   SpO2 98%   BMI 24.86 kg/m   Wt Readings from Last 3 Encounters:  06/05/17 154 lb (69.9  kg)  05/20/17 155 lb 9.6 oz (70.6 kg)  02/15/16 142 lb 12.8 oz (64.8 kg)     General: Appears her stated age, in NAD. HEENT: Head: normal shape and size, no sinus tenderness noted; Throat/Mouth: + PND. Teeth present, mucosa erythematous and moist, no exudate noted, no lesions or ulcerations noted.  Neck: No cervical lymphadenopathy.  Cardiovascular: Normal rate and rhythm. S1,S2 noted.  No murmur, rubs or gallops noted.  Pulmonary/Chest: Normal effort and positive vesicular breath sounds. No respiratory distress. No wheezes, rales or ronchi noted.  Skin: Ganglion cyst noted on posterior left wrist.     Assessment & Plan:   Viral Upper Respiratory Infection with Cough:  Get some rest and drink plenty of water Do salt water gargles for the sore throat Start Zyrtec OTC Delsym as needed for cough  Ganglion Cyst, Left Wrist:  Referral placed to hand surgeon per request  RTC as needed or if symptoms persist.   Webb Silversmith, NP

## 2017-06-05 NOTE — Telephone Encounter (Signed)
Patient informed via my chart.

## 2017-06-05 NOTE — Patient Instructions (Signed)
Ganglion Cyst A ganglion cyst is a noncancerous, fluid-filled lump that occurs near joints or tendons. The ganglion cyst grows out of a joint or the lining of a tendon. It most often develops in the hand or wrist, but it can also develop in the shoulder, elbow, hip, knee, ankle, or foot. The round or oval ganglion cyst can be the size of a pea or larger than a grape. Increased activity may enlarge the size of the cyst because more fluid starts to build up. What are the causes? It is not known what causes a ganglion cyst to grow. However, it may be related to:  Inflammation or irritation around the joint.  An injury.  Repetitive movements or overuse.  Arthritis.  What increases the risk? Risk factors include:  Being a woman.  Being age 20-50.  What are the signs or symptoms? Symptoms may include:  A lump. This most often appears on the hand or wrist, but it can occur in other areas of the body.  Tingling.  Pain.  Numbness.  Muscle weakness.  Weak grip.  Less movement in a joint.  How is this diagnosed? Ganglion cysts are most often diagnosed based on a physical exam. Your health care provider will feel the lump and may shine a light alongside it. If it is a ganglion cyst, a light often shines through it. Your health care provider may order an X-ray, ultrasound, or MRI to rule out other conditions. How is this treated? Ganglion cysts usually go away on their own without treatment. If pain or other symptoms are involved, treatment may be needed. Treatment is also needed if the ganglion cyst limits your movement or if it gets infected. Treatment may include:  Wearing a brace or splint on your wrist or finger.  Taking anti-inflammatory medicine.  Draining fluid from the lump with a needle (aspiration).  Injecting a steroid into the joint.  Surgery to remove the ganglion cyst.  Follow these instructions at home:  Do not press on the ganglion cyst, poke it with a  needle, or hit it.  Take medicines only as directed by your health care provider.  Wear your brace or splint as directed by your health care provider.  Watch your ganglion cyst for any changes.  Keep all follow-up visits as directed by your health care provider. This is important. Contact a health care provider if:  Your ganglion cyst becomes larger or more painful.  You have increased redness, red streaks, or swelling.  You have pus coming from the lump.  You have weakness or numbness in the affected area.  You have a fever or chills. This information is not intended to replace advice given to you by your health care provider. Make sure you discuss any questions you have with your health care provider. Document Released: 08/29/2000 Document Revised: 02/07/2016 Document Reviewed: 02/14/2014 Elsevier Interactive Patient Education  2018 Elsevier Inc.  

## 2017-06-17 DIAGNOSIS — M67432 Ganglion, left wrist: Secondary | ICD-10-CM | POA: Insufficient documentation

## 2018-02-12 ENCOUNTER — Ambulatory Visit: Payer: BC Managed Care – PPO | Admitting: Internal Medicine

## 2018-02-12 ENCOUNTER — Encounter: Payer: Self-pay | Admitting: Internal Medicine

## 2018-02-12 VITALS — BP 116/78 | HR 61 | Temp 98.3°F | Wt 159.0 lb

## 2018-02-12 DIAGNOSIS — J029 Acute pharyngitis, unspecified: Secondary | ICD-10-CM

## 2018-02-12 DIAGNOSIS — H938X3 Other specified disorders of ear, bilateral: Secondary | ICD-10-CM | POA: Diagnosis not present

## 2018-02-12 LAB — POCT RAPID STREP A (OFFICE): Rapid Strep A Screen: NEGATIVE

## 2018-02-12 MED ORDER — METHYLPREDNISOLONE ACETATE 80 MG/ML IJ SUSP
80.0000 mg | Freq: Once | INTRAMUSCULAR | Status: AC
Start: 2018-02-12 — End: 2018-02-12
  Administered 2018-02-12: 80 mg via INTRAMUSCULAR

## 2018-02-12 NOTE — Progress Notes (Signed)
Subjective:    Patient ID: Kim Lee, female    DOB: 04-Apr-1966, 52 y.o.   MRN: [id number]  HPI  Pt presents to the clinic today with c/e ear fullness/pain and sore throat. She reports this started 2 days ago. She is having some difficulty swallowing. She denies decreased hearing. She reports temp of 101.4 this morning, but she denies chills or body aches. She denies runny nose, nasal congestion or cough. She has tried Ibuprofen and [employer] with some relief. She has not had sick contacts that she is aware of.  Review of Systems      Past Medical History:  Diagnosis Date  . Angina    2 years ago.  1 episode,  Sees Dr. Mare Ferrari  . Atypical chest pain    History of   . Family history of premature CAD   . Hypercholesterolemia   . Hyperlipidemia   . Left arm pain    History of   . Left shoulder pain    History of     Current Outpatient Medications  Medication Sig Dispense Refill  . aspirin 81 MG tablet Take 81 mg by mouth daily.    Marland Kitchen ezetimibe (ZETIA) 10 MG tablet TAKE 1 TABLET BY MOUTH DAILY 30 tablet 2  . rosuvastatin (CRESTOR) 10 MG tablet Take 5 mg by mouth daily. Take 1/2 tablet (5 mg) daily 90 tablet 3   No current facility-administered medications for this visit.     Allergies  Allergen Reactions  . Lipitor [Atorvastatin] Anaphylaxis    LEG PAIN     Family History  Problem Relation Age of Onset  . Diabetes Father   . Cancer Father        pancreatic  . Coronary artery disease Mother        with stents  . Hypertension Mother   . Cancer Mother        lung  . Cancer Sister        breast  . Cancer Sister        leukemia  . Hypertension Sister     Social History   Socioeconomic History  . Marital status: Married    Spouse name: Not on file  . Number of children: Not on file  . Years of education: Not on file  . Highest education level: Not on file  Occupational History  . Not on file  Social Needs  . Financial resource strain: Not on  file  . Food insecurity:    Worry: Not on file    Inability: Not on file  . Transportation needs:    Medical: Not on file    Non-medical: Not on file  Tobacco Use  . Smoking status: Former Smoker    Packs/day: 1.00    Years: 15.00    Pack years: 15.00    Types: Cigarettes    Last attempt to quit: 05/05/1999    Years since quitting: 18.7  . Smokeless tobacco: Never Used  Substance and Sexual Activity  . Alcohol use: Yes    Alcohol/week: 0.0 oz    Comment: very rarely  . Drug use: No  . Sexual activity: Yes  Lifestyle  . Physical activity:    Days per week: Not on file    Minutes per session: Not on file  . Stress: Not on file  Relationships  . Social connections:    Talks on phone: Not on file    Gets together: Not on file    Attends  religious service: Not on file    Active member of club or organization: Not on file    Attends meetings of clubs or organizations: Not on file    Relationship status: Not on file  . Intimate partner violence:    Fear of current or ex partner: Not on file    Emotionally abused: Not on file    Physically abused: Not on file    Forced sexual activity: Not on file  Other Topics Concern  . Not on file  Social History Narrative  . Not on file     Constitutional: Pt reports fever. Denies malaise, fatigue, headache or abrupt weight changes.  HEENT: Pt reports ear fullness, sore throat. Denies eye pain, eye redness, ear pain, ringing in the ears, wax buildup, runny nose, nasal congestion, bloody nose. Respiratory: Denies difficulty breathing, shortness of breath, cough or sputum production.    No other specific complaints in a complete review of systems (except as listed in HPI above).  Objective:   Physical Exam   BP 116/78   Pulse 61   Temp 98.3 F (36.8 C) (Oral)   Wt 159 lb (72.1 kg)   SpO2 98%   BMI 25.66 kg/m  Wt Readings from Last 3 Encounters:  02/12/18 159 lb (72.1 kg)  06/05/17 154 lb (69.9 kg)  05/20/17 155 lb 9.6 oz  (70.6 kg)    General: Appears her stated age, in NAD. HEENT: Ears: Tm's gray and intact, normal light reflex; Throat/Mouth: Teeth present, mucosa erythematous and moist, no exudate, lesions or ulcerations noted.  Neck:  No adenopathy noted. Pulmonary/Chest: Normal effort and positive vesicular breath sounds. No respiratory distress. No wheezes, rales or ronchi noted.    BMET    Component Value Date/Time   NA 141 06/03/2017 0737   K 4.3 06/03/2017 0737   CL 105 06/03/2017 0737   CO2 23 06/03/2017 0737   GLUCOSE 87 06/03/2017 0737   GLUCOSE 84 06/04/2016 0734   BUN 10 06/03/2017 0737   CREATININE 0.78 06/03/2017 0737   CREATININE 0.77 06/04/2016 0734   CALCIUM 9.0 06/03/2017 0737   GFRNONAA 88 06/03/2017 0737   GFRAA 102 06/03/2017 0737    Lipid Panel     Component Value Date/Time   CHOL 147 06/03/2017 0737   TRIG 82 06/03/2017 0737   HDL 59 06/03/2017 0737   CHOLHDL 2.5 06/03/2017 0737   CHOLHDL 2.7 06/04/2016 0734   VLDL 22 06/04/2016 0734   LDLCALC 72 06/03/2017 0737    CBC    Component Value Date/Time   WBC 5.7 10/15/2015 0831   RBC 4.32 10/15/2015 0831   HGB 11.8 (L) 10/15/2015 0831   HCT 35.8 (L) 10/15/2015 0831   PLT 339 10/15/2015 0831   MCV 82.9 10/15/2015 0831   MCH 27.3 10/15/2015 0831   MCHC 33.0 10/15/2015 0831   RDW 13.0 10/15/2015 0831   LYMPHSABS 2.1 10/15/2015 0831   MONOABS 0.7 10/15/2015 0831   EOSABS 0.2 10/15/2015 0831   BASOSABS 0.1 10/15/2015 0831    Hgb A1C No results found for: HGBA1C         Assessment & Plan:   Sore Throat, Ear Fullness:  RST: negative 80 mg Depo IM today Start Zyrtec and Flonase OTC Ibuprofen 600 mg every 8 hours prn  Return precautions discussed Webb Silversmith, NP

## 2018-02-12 NOTE — Addendum Note (Signed)
Addended by: Lurlean Nanny on: 02/12/2018 05:01 PM   Modules accepted: Orders

## 2018-02-12 NOTE — Addendum Note (Signed)
Addended by: Lurlean Nanny on: 02/12/2018 05:10 PM   Modules accepted: Orders

## 2018-02-12 NOTE — Patient Instructions (Signed)

## 2018-07-19 ENCOUNTER — Ambulatory Visit: Payer: BC Managed Care – PPO | Admitting: Cardiovascular Disease

## 2018-07-19 ENCOUNTER — Encounter: Payer: Self-pay | Admitting: Cardiovascular Disease

## 2018-07-19 VITALS — BP 128/84 | HR 65 | Ht 66.0 in | Wt 153.8 lb

## 2018-07-19 DIAGNOSIS — R079 Chest pain, unspecified: Secondary | ICD-10-CM

## 2018-07-19 DIAGNOSIS — E782 Mixed hyperlipidemia: Secondary | ICD-10-CM

## 2018-07-19 MED ORDER — ROSUVASTATIN CALCIUM 5 MG PO TABS: 5.0000 mg | ORAL_TABLET | Freq: Every day | ORAL | 3 refills | Status: DC

## 2018-07-19 NOTE — Patient Instructions (Signed)
Medication Instructions:  Your physician recommends that you continue on your current medications as directed. Please refer to the Current Medication list given to you today.  If you need a refill on your cardiac medications before your next appointment, please call your pharmacy.    Lab work: Your physician recommends that you return for lab work in: 3 months  You will need to FAST for this appointment - nothing to eat or drink after midnight the night before except water.    Testing/Procedures: None Ordered    Follow-Up: At [facility], you and your health needs are our priority.  As part of our continuing mission to provide you with exceptional heart care, we have created designated Provider Care Teams.  These Care Teams include your primary Cardiologist (physician) and Advanced Practice Providers (APPs -  Physician Assistants and Nurse Practitioners) who all work together to provide you with the care you need, when you need it. You will need a follow up appointment in:  1 year.  Please call our office 2 months in advance to schedule this appointment.  You may see Mertie Moores, MD or one of the following Advanced Practice Providers on your designated Care Team: Richardson Dopp, PA-C [place], [place] . Daune Perch, NP

## 2018-07-19 NOTE — Progress Notes (Signed)
Cardiology Office Note   Date:  07/19/2018   ID:  Kim, Lee 09/17/1965, MRN [id number]  PCP:  Jearld Fenton, NP  Cardiologist: formerly Darlin Coco MD, now [place]   Chief Complaint  Patient presents with  . Hyperlipidemia        Kim Lee is a 52 y.o. female who presents for 1 year follow-up office visit   This pleasant 52 year old woman has a history of hypercholesterolemia. She does not have any history of ischemic heart disease and she had a normal treadmill Cardiolite stress test 07/31/09. She exercises regularly and does some jogging. In November 2016 she recently had any half marathon down in [place].  He plans to do another half marathon this spring.. She has not been experiencing any chest pain. . The patient has a history of hypercholesterolemia and is on Lipitor.  She has been experiencing myalgias in her legs which she attributes to the Lipitor.  We will switch her to Crestor.She has a past history of anemia.  She takes Flintstones chewable multivitamins.  She is on a birth control pill to help with her heavy periods.  February 15, 2016:  Kim Lee is seen for the first time  - transfer from Dr. Mare Ferrari. She was changed from atorvastatin to Crestor last year. She was having lots of leg pain with the atorvastatin. She still has some leg pain although it seems to be tolerable. She does comment that the Crestor causes her to wake up every night at 3 AM. She's requested changing it to a morning dose.  Still running  - doing a [employer] run in [place] [place] .   She had a dizzy spell several weeks ago.  Went to [facility] the next day .  Work up was normal .   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 5, 2018:  Kim Lee is seen For follow-up of her hyperlipidemia. No CP or dyspnea.  Still runs regularly  - doing a 1/2 marathon in several weeks .  Nov. 4,  2019:  Kim Lee is seen today for follow-up of her hyperlipidemia.  She is had some atypical chest pain in the past.  She has a family history of premature coronary artery disease.  Ran a 1/2 marathon in Sept.  Had intrascapular pain .  Started at mile 7 and lasted until the rest of the race.  No dyspnea,   No dyspnea  Has run since then and has not had any episodes.  Tolerating the crestor but only takes it for several months before she comes for blood work    Past Medical History:  Diagnosis Date  . Angina    2 years ago.  1 episode,  Sees Dr. Mare Ferrari  . Atypical chest pain    History of   . Family history of premature CAD   . Hypercholesterolemia   . Hyperlipidemia   . Left arm pain    History of   . Left shoulder pain    History of     Past Surgical History:  Procedure Laterality Date  . MASS EXCISION  07/30/2011   Procedure: EXCISION MASS;  Surgeon: Wynonia Sours, MD;  Location: [facility];  Service: Orthopedics;  Laterality: Left;  left hand  . TUBAL LIGATION  2001     Current Outpatient Medications  Medication Sig Dispense Refill  . aspirin 81 MG tablet Take 81  mg by mouth daily.    Marland Kitchen ezetimibe (ZETIA) 10 MG tablet TAKE 1 TABLET BY MOUTH DAILY 30 tablet 2  . rosuvastatin (CRESTOR) 5 MG tablet Take 1 tablet (5 mg total) by mouth daily. 90 tablet 3   No current facility-administered medications for this visit.     Allergies:   Lipitor [atorvastatin]    Social History:  The patient  reports that she quit smoking about 19 years ago. Her smoking use included cigarettes. She has a 15.00 pack-year smoking history. She has never used smokeless tobacco. She reports that she drinks alcohol. She reports that she does not use drugs.   Family History:  The patient's family history includes Cancer in her father, mother, sister, and sister; Coronary artery disease in her mother; Diabetes in her father; Hypertension in her mother and sister. her mother had  multiple stents after age 73   ROS:  Please see the history of present illness.   Otherwise, review of systems are positive for none.   All other systems are reviewed and negative.    Physical Exam: Blood pressure 128/84, pulse 65, height 5\' 6"  (1.676 m), weight 153 lb 12.8 oz (69.8 kg), SpO2 96 %.  GEN:  Well nourished, well developed in no acute distress HEENT: Normal NECK: No JVD; No carotid bruits LYMPHATICS: No lymphadenopathy CARDIAC: RRR , no murmurs, rubs, gallops RESPIRATORY:  Clear to auscultation without rales, wheezing or rhonchi  ABDOMEN: Soft, non-tender, non-distended MUSCULOSKELETAL:  No edema; No deformity  SKIN: Warm and dry NEUROLOGIC:  Alert and oriented x 3   EKG:    July 19, 2018:     Normal sinus rhythm at 65 beats minute.  Normal EKG.  Recent Labs: No results found for requested labs within last 8760 hours.    Lipid Panel    Component Value Date/Time   CHOL 147 06/03/2017 0737   TRIG 82 06/03/2017 0737   HDL 59 06/03/2017 0737   CHOLHDL 2.5 06/03/2017 0737   CHOLHDL 2.7 06/04/2016 0734   VLDL 22 06/04/2016 0734   LDLCALC 72 06/03/2017 0737      Wt Readings from Last 3 Encounters:  07/19/18 153 lb 12.8 oz (69.8 kg)  02/12/18 159 lb (72.1 kg)  06/05/17 154 lb (69.9 kg)         ASSESSMENT AND PLAN:  1.  Hypercholesterolemia -she does not take her Crestor or Zetia on a regular basis.  She typically takes in a month or so her to her blood draw.  I have encouraged her to take her medicine year-round.  2.  Family history of premature coronary disease  3.  Past history of iron deficiency anemia  4.   Shoulder pain :  Occurred only once .   She has run many times since that time without any chest pain .   Will continue to watch  If she has any additional episodes of CP, I think a coronary CT angiogram would be appropriate  Current medicines are reviewed at length with the patient today.  The patient does not have concerns regarding  medicines.  The following changes have been made:  no change  Labs/ tests ordered today include:   Orders Placed This Encounter  Procedures  . Lipid Profile  . Basic Metabolic Panel (BMET)  . Hepatic function panel  . EKG 12-Lead     Mertie Moores, MD  07/19/2018 4:13 PM    [facility] [address],  [place],  [place]  [address] Pager [phone] Phone: [phone]; Fax: [phone]

## 2018-07-22 ENCOUNTER — Other Ambulatory Visit: Payer: Self-pay | Admitting: Cardiovascular Disease

## 2018-09-22 ENCOUNTER — Ambulatory Visit: Payer: BC Managed Care – PPO | Admitting: Family Medicine

## 2018-09-22 ENCOUNTER — Encounter: Payer: Self-pay | Admitting: Family Medicine

## 2018-09-22 VITALS — BP 112/70 | HR 79 | Temp 98.7°F | Ht 66.0 in | Wt 156.0 lb

## 2018-09-22 DIAGNOSIS — R51 Headache: Secondary | ICD-10-CM | POA: Diagnosis not present

## 2018-09-22 DIAGNOSIS — R221 Localized swelling, mass and lump, neck: Secondary | ICD-10-CM | POA: Insufficient documentation

## 2018-09-22 DIAGNOSIS — R519 Headache, unspecified: Secondary | ICD-10-CM

## 2018-09-22 DIAGNOSIS — R5383 Other fatigue: Secondary | ICD-10-CM | POA: Insufficient documentation

## 2018-09-22 DIAGNOSIS — R52 Pain, unspecified: Secondary | ICD-10-CM | POA: Diagnosis not present

## 2018-09-22 LAB — VITAMIN B12: Vitamin B-12: 362 pg/mL (ref 211–911)

## 2018-09-22 LAB — COMPREHENSIVE METABOLIC PANEL
ALBUMIN: 4.3 g/dL (ref 3.5–5.2)
ALT: 10 U/L (ref 0–35)
AST: 13 U/L (ref 0–37)
Alkaline Phosphatase: 54 U/L (ref 39–117)
BUN: 13 mg/dL (ref 6–23)
CO2: 27 mEq/L (ref 19–32)
Calcium: 9.6 mg/dL (ref 8.4–10.5)
Chloride: 101 mEq/L (ref 96–112)
Creatinine, Ser: 0.67 mg/dL (ref 0.40–1.20)
GFR: 98.09 mL/min (ref >60.00)
Glucose, Bld: 85 mg/dL (ref 70–99)
Potassium: 4.3 mEq/L (ref 3.5–5.1)
Sodium: 137 mEq/L (ref 135–145)
TOTAL PROTEIN: 7.7 g/dL (ref 6.0–8.3)
Total Bilirubin: 0.4 mg/dL (ref 0.2–1.2)

## 2018-09-22 LAB — CBC WITH DIFFERENTIAL/PLATELET
BASOS ABS: 0.1 10*3/uL (ref 0.0–0.1)
Basophils Relative: 0.5 % (ref 0.0–3.0)
EOS ABS: 0.1 10*3/uL (ref 0.0–0.7)
Eosinophils Relative: 0.8 % (ref 0.0–5.0)
HCT: 37.8 % (ref 36.0–46.0)
HEMOGLOBIN: 12.5 g/dL (ref 12.0–15.0)
LYMPHS PCT: 16.1 % (ref 12.0–46.0)
Lymphs Abs: 1.7 10*3/uL (ref 0.7–4.0)
MCHC: 33.1 g/dL (ref 30.0–36.0)
MCV: 86.7 fl (ref 78.0–100.0)
MONOS PCT: 8.6 % (ref 3.0–12.0)
Monocytes Absolute: 0.9 10*3/uL (ref 0.1–1.0)
NEUTROS ABS: 7.8 10*3/uL — AB (ref 1.4–7.7)
Neutrophils Relative %: 74 % (ref 43.0–77.0)
Platelets: 438 10*3/uL — ABNORMAL HIGH (ref 150.0–400.0)
RBC: 4.36 Mil/uL (ref 3.87–5.11)
RDW: 12.4 % (ref 11.5–15.5)
WBC: 10.5 10*3/uL (ref 4.0–10.5)

## 2018-09-22 LAB — T4, FREE: Free T4: 1.04 ng/dL (ref 0.60–1.60)

## 2018-09-22 LAB — TSH: TSH: 0.53 u[IU]/mL (ref 0.35–4.50)

## 2018-09-22 NOTE — Assessment & Plan Note (Addendum)
Lump felt over ant neck in R thyroid area  Thyroid US planned Also tsh/FT4  ? Poss thyroiditis in light of malaise as well No fever however  Cbc pending

## 2018-09-22 NOTE — Progress Notes (Signed)
Subjective:    Patient ID: Kim Lee, female    DOB: 1965-12-11, 53 y.o.   MRN: [id number]  HPI  54 yo pt of NP [facility] Problem has been going on for 1 1/2 weeks  Here for ? Swollen gland in her neck with discomfort  Also facial pain  Some body aches (without fever)   Started with tenderness in anterior neck (right side- lump) also sore to the touch  Hurt to swallow (not a sore throat) R ear hurts  No sinus pain or pressure  Just started a very mild runny nose yesterday  No new pnd or throat clearing   No issues with acid reflux  No n/v/d   No tick bites  No rash   She is on crestor 5 mg (atorvastatin gives her leg pain )   Motrin helps   occ a little dry cough (mild)    Went to dentist-no problems   Former smoker Quit in [employer] Readings from Last 3 Encounters:  09/22/18 156 lb (70.8 kg)  07/19/18 153 lb 12.8 oz (69.8 kg)  02/12/18 159 lb (72.1 kg)   25.18 kg/m    Had a sister with leukemia  Lab Results  Component Value Date   WBC 5.7 10/15/2015   HGB 11.8 (L) 10/15/2015   HCT 35.8 (L) 10/15/2015   MCV 82.9 10/15/2015   PLT 339 10/15/2015    Lab Results  Component Value Date   TSH 1.58 01/22/2016    Patient Active Problem List   Diagnosis Date Noted  . Body aches 09/22/2018  . Fatigue 09/22/2018  . Facial pain 09/22/2018  . Localized swelling, mass and lump, neck 09/22/2018  . HLD (hyperlipidemia) 01/22/2016  . Family history of early CAD    Past Medical History:  Diagnosis Date  . Angina    2 years ago.  1 episode,  Sees Dr. Mare Ferrari  . Atypical chest pain    History of   . Family history of premature CAD   . Hypercholesterolemia   . Hyperlipidemia   . Left arm pain    History of   . Left shoulder pain    History of    Past Surgical History:  Procedure Laterality Date  . MASS EXCISION  07/30/2011   Procedure: EXCISION MASS;  Surgeon: Wynonia Sours, MD;  Location: [place];  Service: Orthopedics;   Laterality: Left;  left hand  . TUBAL LIGATION  2001   Social History   Tobacco Use  . Smoking status: Former Smoker    Packs/day: 1.00    Years: 15.00    Pack years: 15.00    Types: Cigarettes    Last attempt to quit: 05/05/1999    Years since quitting: 19.3  . Smokeless tobacco: Never Used  Substance Use Topics  . Alcohol use: Yes    Alcohol/week: 0.0 standard drinks    Comment: very rarely  . Drug use: No   Family History  Problem Relation Age of Onset  . Diabetes Father   . Cancer Father        pancreatic  . Coronary artery disease Mother        with stents  . Hypertension Mother   . Cancer Mother        lung  . Cancer Sister        breast  . Cancer Sister        leukemia  . Hypertension Sister    Allergies  Allergen  Reactions  . Lipitor [Atorvastatin] Anaphylaxis    LEG PAIN    Current Outpatient Medications on File Prior to Visit  Medication Sig Dispense Refill  . aspirin 81 MG tablet Take 81 mg by mouth daily.    Marland Kitchen ezetimibe (ZETIA) 10 MG tablet TAKE 1 TABLET BY MOUTH DAILY 90 tablet 3  . rosuvastatin (CRESTOR) 5 MG tablet Take 1 tablet (5 mg total) by mouth daily. 90 tablet 3   No current facility-administered medications on file prior to visit.     Review of Systems  Constitutional: Positive for fatigue. Negative for activity change, appetite change, chills, diaphoresis, fever and unexpected weight change.       Body aches  HENT: Negative for congestion, ear pain, rhinorrhea, sinus pressure and sore throat.        Lump in neck   R sided facial pain   Eyes: Negative for pain, redness and visual disturbance.  Respiratory: Negative for cough, shortness of breath and wheezing.   Cardiovascular: Negative for chest pain and palpitations.  Gastrointestinal: Negative for abdominal pain, blood in stool, constipation and diarrhea.  Endocrine: Negative for polydipsia and polyuria.  Genitourinary: Negative for dysuria, frequency and urgency.    Musculoskeletal: Positive for myalgias. Negative for arthralgias, back pain, gait problem and joint swelling.  Skin: Negative for pallor and rash.  Allergic/Immunologic: Negative for environmental allergies.  Neurological: Negative for dizziness, syncope and headaches.  Hematological: Negative for adenopathy. Does not bruise/bleed easily.  Psychiatric/Behavioral: Negative for decreased concentration and dysphoric mood. The patient is not nervous/anxious.        Objective:   Physical Exam Constitutional:      General: She is not in acute distress.    Appearance: Normal appearance. She is normal weight. She is not ill-appearing or diaphoretic.  HENT:     Head: Normocephalic and atraumatic.     Comments: R TM joint tenderness w/o disloc or clicking  No swelling  No temporal or sinus tenderness No skin change     Right Ear: Tympanic membrane and ear canal normal.     Left Ear: Tympanic membrane and ear canal normal.     Nose: Rhinorrhea present.     Comments: Nares are boggy    Mouth/Throat:     Mouth: Mucous membranes are moist.     Pharynx: Oropharynx is clear. No oropharyngeal exudate or posterior oropharyngeal erythema.  Eyes:     General: No scleral icterus.       Right eye: No discharge.        Left eye: No discharge.     Extraocular Movements: Extraocular movements intact.     Conjunctiva/sclera: Conjunctivae normal.     Pupils: Pupils are equal, round, and reactive to light.  Neck:     Musculoskeletal: Normal range of motion. Muscular tenderness present. No neck rigidity.     Vascular: No carotid bruit.     Comments: 1 cm soft lump felt over area of R thyroid  Mildly tender No skin changes  Cardiovascular:     Rate and Rhythm: Normal rate and regular rhythm.     Pulses: Normal pulses.     Heart sounds: Normal heart sounds.  Pulmonary:     Effort: Pulmonary effort is normal. No respiratory distress.     Breath sounds: No wheezing or rales.  Abdominal:      General: Abdomen is flat. Bowel sounds are normal. There is no distension.     Palpations: There is no mass.  Tenderness: There is no abdominal tenderness.  Musculoskeletal:        General: No swelling, tenderness, deformity or signs of injury.     Right lower leg: No edema.     Left lower leg: No edema.     Comments: No joint or myofascial tenderness  Lymphadenopathy:     Cervical: No cervical adenopathy.  Skin:    General: Skin is warm and dry.     Capillary Refill: Capillary refill takes less than 2 seconds.     Coloration: Skin is not jaundiced or pale.     Findings: No erythema or rash.  Neurological:     General: No focal deficit present.     Mental Status: She is alert.     Cranial Nerves: No cranial nerve deficit.  Psychiatric:        Mood and Affect: Mood normal.           Assessment & Plan:   Problem List Items Addressed This Visit      Other   Localized swelling, mass and lump, neck - Primary    Lump felt over ant neck in R thyroid area  Thyroid US planned Also tsh/FT4  ? Poss thyroiditis in light of malaise as well      Relevant Orders   US THYROID   TSH   T4, free   Fatigue    In addition to ? Thyroid lump or nodule Body aches/no fever Facial pain   Labs today  Thyroid US      Relevant Orders   CBC with Differential/Platelet   Comprehensive metabolic panel   TSH   Vitamin B12   T4, free   Facial pain    Right jaw along with body aches and ? Thyroid nodule /lump in ant neck  Pain over TMJ- dentist did recommend bite guard   Labs today  Reassuring exam  US neck ordered       Body aches    Labs today  Some discomfort over ant neck- ? Thyroid nodule  Some runny nose ? If coming down with uri (early)  Also on crestor -has not given side effects before but did have leg pain on atorvastatin  No fever

## 2018-09-22 NOTE — Patient Instructions (Signed)
Labs today   Continue tylenol/motrin or both as directed  Rest  Drink lots of fluids  If symptoms suddenly worsen or you develop fever or swelling or rash please let us know  You may need to consider a bit guard later   We will arrange a thyroid ultrasound for the lump that you feel also

## 2018-09-22 NOTE — Assessment & Plan Note (Signed)
Right jaw along with body aches and ? Thyroid nodule /lump in ant neck  Pain over TMJ- dentist did recommend bite guard   Labs today  Reassuring exam  US neck ordered

## 2018-09-22 NOTE — Assessment & Plan Note (Signed)
In addition to ? Thyroid lump or nodule Body aches/no fever Facial pain   Labs today  Thyroid US

## 2018-09-22 NOTE — Assessment & Plan Note (Signed)
Labs today  Some discomfort over ant neck- ? Thyroid nodule  Some runny nose ? If coming down with uri (early)  Also on crestor -has not given side effects before but did have leg pain on atorvastatin  No fever

## 2018-09-24 ENCOUNTER — Ambulatory Visit
Admission: RE | Admit: 2018-09-24 | Discharge: 2018-09-24 | Disposition: A | Discharge status: Home / Self Care | Payer: BC Managed Care – PPO | Source: Ambulatory Visit | Attending: Family Medicine | Admitting: Family Medicine

## 2018-09-24 DIAGNOSIS — R221 Localized swelling, mass and lump, neck: Secondary | ICD-10-CM

## 2018-09-27 ENCOUNTER — Telehealth: Payer: Self-pay | Admitting: Family Medicine

## 2018-09-27 DIAGNOSIS — R519 Headache, unspecified: Secondary | ICD-10-CM

## 2018-09-27 DIAGNOSIS — R52 Pain, unspecified: Secondary | ICD-10-CM

## 2018-09-27 DIAGNOSIS — R221 Localized swelling, mass and lump, neck: Secondary | ICD-10-CM

## 2018-09-27 DIAGNOSIS — R51 Headache: Principal | ICD-10-CM

## 2018-09-27 NOTE — Telephone Encounter (Signed)
Ref done-will route to [facility] I am not in the office today  Go to ED if symptoms become severe

## 2018-09-27 NOTE — Telephone Encounter (Signed)
-----   Message from Tammi Sou, [place] sent at 09/27/2018  3:57 PM EST ----- Pt does agree with referral and requesting to see ENT ASAP given sxs and now she has a low grade fever, I asked pt what her temp was and she said 99.2

## 2018-09-28 DIAGNOSIS — E069 Thyroiditis, unspecified: Secondary | ICD-10-CM | POA: Insufficient documentation

## 2018-09-28 NOTE — Telephone Encounter (Signed)
Appt made with Dr Izora Gala on 09/28/2018 at 1pm.

## 2018-10-20 ENCOUNTER — Other Ambulatory Visit: Payer: BC Managed Care – PPO

## 2018-12-10 ENCOUNTER — Other Ambulatory Visit: Payer: BC Managed Care – PPO

## 2019-01-12 ENCOUNTER — Other Ambulatory Visit: Payer: BC Managed Care – PPO | Admitting: *Deleted

## 2019-01-12 ENCOUNTER — Other Ambulatory Visit: Payer: Self-pay

## 2019-01-12 DIAGNOSIS — R079 Chest pain, unspecified: Secondary | ICD-10-CM

## 2019-01-12 DIAGNOSIS — E782 Mixed hyperlipidemia: Secondary | ICD-10-CM

## 2019-01-12 LAB — BASIC METABOLIC PANEL
BUN/Creatinine Ratio: 21 (ref 9–23)
BUN: 16 mg/dL (ref 6–24)
CO2: 24 mmol/L (ref 20–29)
Calcium: 9.5 mg/dL (ref 8.7–10.2)
Chloride: 105 mmol/L (ref 96–106)
Creatinine, Ser: 0.77 mg/dL (ref 0.57–1.00)
GFR calc Af Amer: 103 mL/min/{1.73_m2} (ref >59)
GFR calc non Af Amer: 89 mL/min/{1.73_m2} (ref >59)
Glucose: 90 mg/dL (ref 65–99)
Potassium: 4.2 mmol/L (ref 3.5–5.2)
Sodium: 144 mmol/L (ref 134–144)

## 2019-01-12 LAB — LIPID PANEL
Chol/HDL Ratio: 2.2 ratio (ref 0.0–4.4)
Cholesterol, Total: 128 mg/dL (ref 100–199)
HDL: 59 mg/dL (ref >39)
LDL Calculated: 51 mg/dL (ref 0–99)
Triglycerides: 90 mg/dL (ref 0–149)
VLDL Cholesterol Cal: 18 mg/dL (ref 5–40)

## 2019-01-12 LAB — HEPATIC FUNCTION PANEL
ALT: 20 IU/L (ref 0–32)
AST: 19 IU/L (ref 0–40)
Albumin: 4.5 g/dL (ref 3.8–4.9)
Alkaline Phosphatase: 49 IU/L (ref 39–117)
Bilirubin Total: 0.5 mg/dL (ref 0.0–1.2)
Bilirubin, Direct: 0.16 mg/dL (ref 0.00–0.40)
Total Protein: 6.8 g/dL (ref 6.0–8.5)

## 2019-03-08 ENCOUNTER — Other Ambulatory Visit: Payer: Self-pay

## 2019-03-08 ENCOUNTER — Ambulatory Visit (INDEPENDENT_AMBULATORY_CARE_PROVIDER_SITE_OTHER): Payer: BC Managed Care – PPO

## 2019-03-08 ENCOUNTER — Ambulatory Visit: Payer: BC Managed Care – PPO | Admitting: Podiatry

## 2019-03-08 ENCOUNTER — Encounter: Payer: Self-pay | Admitting: Podiatry

## 2019-03-08 VITALS — BP 136/83 | HR 70 | Temp 97.9°F | Resp 16

## 2019-03-08 DIAGNOSIS — M722 Plantar fascial fibromatosis: Secondary | ICD-10-CM

## 2019-03-08 MED ORDER — MELOXICAM 15 MG PO TABS: 15.0000 mg | ORAL_TABLET | Freq: Every day | ORAL | 3 refills | Status: DC

## 2019-03-08 MED ORDER — METHYLPREDNISOLONE 4 MG PO TBPK: ORAL_TABLET | ORAL | 0 refills | Status: DC

## 2019-03-08 NOTE — Patient Instructions (Signed)

## 2019-03-09 NOTE — Progress Notes (Signed)
Subjective:  Patient ID: Kim Lee, female    DOB: 1965-09-25,  MRN: [id number] HPI Chief Complaint  Patient presents with  . Foot Pain    Plantar heel bilateral (R>L) - aching, burning x 8 months, AM pain, tried massaging, Tylenol (PRN), used to be an active runner-did marathons  . New Patient (Initial Visit)    53 y.o. female presents with the above complaint.   ROS: Denies fever chills nausea vomiting muscle aches pains calf pain back pain chest pain shortness of breath.  Past Medical History:  Diagnosis Date  . Angina    2 years ago.  1 episode,  Sees Dr. Mare Ferrari  . Atypical chest pain    History of   . Family history of premature CAD   . Hypercholesterolemia   . Hyperlipidemia   . Left arm pain    History of   . Left shoulder pain    History of    Past Surgical History:  Procedure Laterality Date  . MASS EXCISION  07/30/2011   Procedure: EXCISION MASS;  Surgeon: Wynonia Sours, MD;  Location: [place];  Service: Orthopedics;  Laterality: Left;  left hand  . TUBAL LIGATION  2001    Current Outpatient Medications:  .  aspirin 81 MG tablet, Take 81 mg by mouth daily., Disp: , Rfl:  .  ezetimibe (ZETIA) 10 MG tablet, TAKE 1 TABLET BY MOUTH DAILY, Disp: 90 tablet, Rfl: 3 .  meloxicam (MOBIC) 15 MG tablet, Take 1 tablet (15 mg total) by mouth daily., Disp: 30 tablet, Rfl: 3 .  methylPREDNISolone (MEDROL DOSEPAK) 4 MG TBPK tablet, 6 day dose pack - take as directed, Disp: 21 tablet, Rfl: 0 .  rosuvastatin (CRESTOR) 5 MG tablet, Take 1 tablet (5 mg total) by mouth daily., Disp: 90 tablet, Rfl: 3  Allergies  Allergen Reactions  . Lipitor [Atorvastatin] Anaphylaxis    LEG PAIN    Review of Systems Objective:   Vitals:   03/08/19 0930  BP: 136/83  Pulse: 70  Resp: 16  Temp: 97.9 F (36.6 C)    General: Well developed, nourished, in no acute distress, alert and oriented x3   Dermatological: Skin is warm, dry and supple bilateral. Nails  x 10 are well maintained; remaining integument appears unremarkable at this time. There are no open sores, no preulcerative lesions, no rash or signs of infection present.  Vascular: Dorsalis Pedis artery and Posterior Tibial artery pedal pulses are 2/4 bilateral with immedate capillary fill time. Pedal hair growth present. No varicosities and no lower extremity edema present bilateral.   Neruologic: Grossly intact via light touch bilateral. Vibratory intact via tuning fork bilateral. Protective threshold with Semmes Wienstein monofilament intact to all pedal sites bilateral. Patellar and Achilles deep tendon reflexes 2+ bilateral. No Babinski or clonus noted bilateral.   Musculoskeletal: No gross boney pedal deformities bilateral. No pain, crepitus, or limitation noted with foot and ankle range of motion bilateral. Muscular strength 5/5 in all groups tested bilateral.  She has pain on palpation medial calcaneal tubercles bilaterally right greater than left.  Gait: Unassisted, Nonantalgic.    Radiographs:  Radiographs taken today demonstrate soft tissue increase in density plantar fascial calcaneal insertion sites bilaterally.  Right greater than left otherwise no acute findings.  Assessment & Plan:   Assessment: Plantar fasciitis bilateral.  Plan: After sterile Betadine skin prep I injected 20 mg of Kenalog 5 mg of Marcaine point of maximal tenderness.  Tolerated the procedure well.  Start her on a Medrol Dosepak to be followed by meloxicam.  Placed her in bilateral plantar fascial braces and a single night splint.  Discussed appropriate shoe gear stretching exercises ice therapy and shoe gear modifications.     Max T. [place], [place]

## 2019-03-23 ENCOUNTER — Other Ambulatory Visit: Payer: Self-pay | Admitting: Endocrinology

## 2019-03-23 DIAGNOSIS — E041 Nontoxic single thyroid nodule: Secondary | ICD-10-CM

## 2019-03-29 ENCOUNTER — Ambulatory Visit
Admission: RE | Admit: 2019-03-29 | Discharge: 2019-03-29 | Disposition: A | Discharge status: Home / Self Care | Payer: BC Managed Care – PPO | Source: Ambulatory Visit | Attending: Endocrinology | Admitting: Endocrinology

## 2019-03-29 DIAGNOSIS — E041 Nontoxic single thyroid nodule: Secondary | ICD-10-CM

## 2019-04-07 ENCOUNTER — Ambulatory Visit (INDEPENDENT_AMBULATORY_CARE_PROVIDER_SITE_OTHER): Payer: BC Managed Care – PPO | Admitting: Internal Medicine

## 2019-04-07 ENCOUNTER — Other Ambulatory Visit: Payer: Self-pay

## 2019-04-07 ENCOUNTER — Encounter: Payer: Self-pay | Admitting: Internal Medicine

## 2019-04-07 VITALS — Wt 156.0 lb

## 2019-04-07 DIAGNOSIS — R142 Eructation: Secondary | ICD-10-CM | POA: Diagnosis not present

## 2019-04-07 DIAGNOSIS — R140 Abdominal distension (gaseous): Secondary | ICD-10-CM

## 2019-04-07 DIAGNOSIS — R110 Nausea: Secondary | ICD-10-CM

## 2019-04-07 DIAGNOSIS — R1012 Left upper quadrant pain: Secondary | ICD-10-CM

## 2019-04-07 NOTE — Progress Notes (Signed)
Virtual Visit via Video Note  I connected with Kim Lee on 04/07/19 at  8:45 AM EDT by a video enabled telemedicine application and verified that I am speaking with the correct person using two identifiers.  Location: Patient: [place] Provider: Office   I discussed the limitations of evaluation and management by telemedicine and the availability of in person appointments. The patient expressed understanding and agreed to proceed.  History of Present Illness:  Pt reports chronic LUQ abdominal pain. This started 2 years ago. She describes the pain as constant, sharp stabbing. The pain does not radiate. It does seem worse after eating and drinking alcohol. She reports some associated fatigue, belching, bloating and nausea but denies vomiting, constipation, diarrhea or blood in her stool. She has tried Omeprazole without any relief. She has not had sick contacts.    Past Medical History:  Diagnosis Date  . Angina    2 years ago.  1 episode,  Sees Dr. Mare Ferrari  . Atypical chest pain    History of   . Family history of premature CAD   . Hypercholesterolemia   . Hyperlipidemia   . Left arm pain    History of   . Left shoulder pain    History of     Current Outpatient Medications  Medication Sig Dispense Refill  . aspirin 81 MG tablet Take 81 mg by mouth daily.    Marland Kitchen ezetimibe (ZETIA) 10 MG tablet TAKE 1 TABLET BY MOUTH DAILY 90 tablet 3  . meloxicam (MOBIC) 15 MG tablet Take 1 tablet (15 mg total) by mouth daily. 30 tablet 3  . methylPREDNISolone (MEDROL DOSEPAK) 4 MG TBPK tablet 6 day dose pack - take as directed 21 tablet 0  . rosuvastatin (CRESTOR) 5 MG tablet Take 1 tablet (5 mg total) by mouth daily. 90 tablet 3   No current facility-administered medications for this visit.     Allergies  Allergen Reactions  . Lipitor [Atorvastatin] Anaphylaxis    LEG PAIN     Family History  Problem Relation Age of Onset  . Diabetes Father   . Cancer Father    pancreatic  . Coronary artery disease Mother        with stents  . Hypertension Mother   . Cancer Mother        lung  . Cancer Sister        breast  . Cancer Sister        leukemia  . Hypertension Sister     Social History   Socioeconomic History  . Marital status: Married    Spouse name: Not on file  . Number of children: Not on file  . Years of education: Not on file  . Highest education level: Not on file  Occupational History  . Not on file  Social Needs  . Financial resource strain: Not on file  . Food insecurity    Worry: Not on file    Inability: Not on file  . Transportation needs    Medical: Not on file    Non-medical: Not on file  Tobacco Use  . Smoking status: Former Smoker    Packs/day: 1.00    Years: 15.00    Pack years: 15.00    Types: Cigarettes    Quit date: 05/05/1999    Years since quitting: 19.9  . Smokeless tobacco: Never Used  Substance and Sexual Activity  . Alcohol use: Yes    Alcohol/week: 0.0 standard drinks    Comment:  very rarely  . Drug use: No  . Sexual activity: Yes  Lifestyle  . Physical activity    Days per week: Not on file    Minutes per session: Not on file  . Stress: Not on file  Relationships  . Social Herbalist on phone: Not on file    Gets together: Not on file    Attends religious service: Not on file    Active member of club or organization: Not on file    Attends meetings of clubs or organizations: Not on file    Relationship status: Not on file  . Intimate partner violence    Fear of current or ex partner: Not on file    Emotionally abused: Not on file    Physically abused: Not on file    Forced sexual activity: Not on file  Other Topics Concern  . Not on file  Social History Narrative  . Not on file     Constitutional: Pt reports fatigue. Denies fever, malaise, fatigue, headache or abrupt weight changes.  Respiratory: Denies difficulty breathing, shortness of breath, cough or sputum  production.   Cardiovascular: Denies chest pain, chest tightness, palpitations or swelling in the hands or feet.  Gastrointestinal: Pt reports nausea, LUQ abdominal pain. Denies  constipation, diarrhea or blood in the stool.  GU: Denies urgency, frequency, pain with urination, burning sensation, blood in urine, odor or discharge. Musculoskeletal: Denies decrease in range of motion, difficulty with gait, muscle pain or joint pain and swelling.  Skin: Denies redness, rashes, lesions or ulcercations.    No other specific complaints in a complete review of systems (except as listed in HPI above).  Observations/Objective:   Wt Readings from Last 3 Encounters:  09/22/18 156 lb (70.8 kg)  07/19/18 153 lb 12.8 oz (69.8 kg)  02/12/18 159 lb (72.1 kg)    General: Appears her stated age, well developed, well nourished in NAD. Skin: Warm, dry and intact. No rashesnoted. Pulmonary/Chest: Normal effort . No respiratory distress.  Abdomen: Soft and nontender.  No obvious distention or masses noted.  Neurological: Alert and oriented.    BMET    Component Value Date/Time   NA 144 01/12/2019 0739   K 4.2 01/12/2019 0739   CL 105 01/12/2019 0739   CO2 24 01/12/2019 0739   GLUCOSE 90 01/12/2019 0739   GLUCOSE 85 09/22/2018 1128   BUN 16 01/12/2019 0739   CREATININE 0.77 01/12/2019 0739   CREATININE 0.77 06/04/2016 0734   CALCIUM 9.5 01/12/2019 0739   GFRNONAA 89 01/12/2019 0739   GFRAA 103 01/12/2019 0739    Lipid Panel     Component Value Date/Time   CHOL 128 01/12/2019 0739   TRIG 90 01/12/2019 0739   HDL 59 01/12/2019 0739   CHOLHDL 2.2 01/12/2019 0739   CHOLHDL 2.7 06/04/2016 0734   VLDL 22 06/04/2016 0734   LDLCALC 51 01/12/2019 0739    CBC    Component Value Date/Time   WBC 10.5 09/22/2018 1128   RBC 4.36 09/22/2018 1128   HGB 12.5 09/22/2018 1128   HCT 37.8 09/22/2018 1128   PLT 438.0 (H) 09/22/2018 1128   MCV 86.7 09/22/2018 1128   MCH 27.3 10/15/2015 0831   MCHC  33.1 09/22/2018 1128   RDW 12.4 09/22/2018 1128   LYMPHSABS 1.7 09/22/2018 1128   MONOABS 0.9 09/22/2018 1128   EOSABS 0.1 09/22/2018 1128   BASOSABS 0.1 09/22/2018 1128    Hgb A1C No results found for: HGBA1C  Assessment and Plan:  LUQ Pain, Nausea, Belching, Bloating:  Will check CBC, CMET, Amylase, Lipase and H Pylori Continue Prilosec OTC Consider referral to GI for upper GI vs CT scan of abdomen  Will follow up after labs, return precautions discussed  Follow Up Instructions:    I discussed the assessment and treatment plan with the patient. The patient was provided an opportunity to ask questions and all were answered. The patient agreed with the plan and demonstrated an understanding of the instructions.   The patient was advised to call back or seek an in-person evaluation if the symptoms worsen or if the condition fails to improve as anticipated.    Webb Silversmith, NP

## 2019-04-08 LAB — COMPREHENSIVE METABOLIC PANEL
ALT: 20 IU/L (ref 0–32)
AST: 17 IU/L (ref 0–40)
Albumin/Globulin Ratio: 2 (ref 1.2–2.2)
Albumin: 4.7 g/dL (ref 3.8–4.9)
Alkaline Phosphatase: 49 IU/L (ref 39–117)
BUN/Creatinine Ratio: 17 (ref 9–23)
BUN: 15 mg/dL (ref 6–24)
Bilirubin Total: 0.5 mg/dL (ref 0.0–1.2)
CO2: 23 mmol/L (ref 20–29)
Calcium: 9.2 mg/dL (ref 8.7–10.2)
Chloride: 104 mmol/L (ref 96–106)
Creatinine, Ser: 0.89 mg/dL (ref 0.57–1.00)
GFR calc Af Amer: 86 mL/min/{1.73_m2} (ref >59)
GFR calc non Af Amer: 75 mL/min/{1.73_m2} (ref >59)
Globulin, Total: 2.3 g/dL (ref 1.5–4.5)
Glucose: 82 mg/dL (ref 65–99)
Potassium: 4.3 mmol/L (ref 3.5–5.2)
Sodium: 141 mmol/L (ref 134–144)
Total Protein: 7 g/dL (ref 6.0–8.5)

## 2019-04-08 LAB — AMYLASE: Amylase: 51 U/L (ref 31–110)

## 2019-04-08 LAB — CBC
Hematocrit: 40.1 % (ref 34.0–46.6)
Hemoglobin: 13.4 g/dL (ref 11.1–15.9)
MCH: 28.9 pg (ref 26.6–33.0)
MCHC: 33.4 g/dL (ref 31.5–35.7)
MCV: 87 fL (ref 79–97)
Platelets: 271 10*3/uL (ref 150–450)
RBC: 4.63 x10E6/uL (ref 3.77–5.28)
RDW: 13.2 % (ref 11.7–15.4)
WBC: 7.4 10*3/uL (ref 3.4–10.8)

## 2019-04-08 LAB — LIPASE: Lipase: 24 U/L (ref 14–72)

## 2019-04-08 LAB — H. PYLORI ANTIBODY, IGG: H. pylori, IgG AbS: 0.2 Index Value (ref 0.00–0.79)

## 2019-04-08 NOTE — Patient Instructions (Signed)
Abdominal Pain, Adult    Many things can cause belly (abdominal) pain. Most times, belly pain is not dangerous. Many cases of belly pain can be watched and treated at home. Sometimes belly pain is serious, though. Your doctor will try to find the cause of your belly pain.  Follow these instructions at home:  · Take over-the-counter and prescription medicines only as told by your doctor. Do not take medicines that help you poop (laxatives) unless told to by your doctor.  · Drink enough fluid to keep your pee (urine) clear or pale yellow.  · Watch your belly pain for any changes.  · Keep all follow-up visits as told by your doctor. This is important.  Contact a doctor if:  · Your belly pain changes or gets worse.  · You are not hungry, or you lose weight without trying.  · You are having trouble pooping (constipated) or have watery poop (diarrhea) for more than 2-3 days.  · You have pain when you pee or poop.  · Your belly pain wakes you up at night.  · Your pain gets worse with meals, after eating, or with certain foods.  · You are throwing up and cannot keep anything down.  · You have a fever.  Get help right away if:  · Your pain does not go away as soon as your doctor says it should.  · You cannot stop throwing up.  · Your pain is only in areas of your belly, such as the right side or the left lower part of the belly.  · You have bloody or black poop, or poop that looks like tar.  · You have very bad pain, cramping, or bloating in your belly.  · You have signs of not having enough fluid or water in your body (dehydration), such as:  ? Dark pee, very little pee, or no pee.  ? Cracked lips.  ? Dry mouth.  ? Sunken eyes.  ? Sleepiness.  ? Weakness.  This information is not intended to replace advice given to you by your health care provider. Make sure you discuss any questions you have with your health care provider.  Document Released: 02/18/2008 Document Revised: 03/21/2016 Document Reviewed: 02/13/2016  Elsevier  Interactive Patient Education © 2020 [employer]

## 2019-04-12 IMAGING — US US THYROID
1 series · 14 of 25 positions shown · non-contrast
Comparison: None.

CLINICAL DATA: Palpable nodule

EXAM:
THYROID ULTRASOUND
TECHNIQUE: Ultrasound examination of the thyroid gland and adjacent soft
tissues was performed.

[Series 1: us thyroid · 0.07mm/px · 14 of 47 slices shown]
[im 1/47]
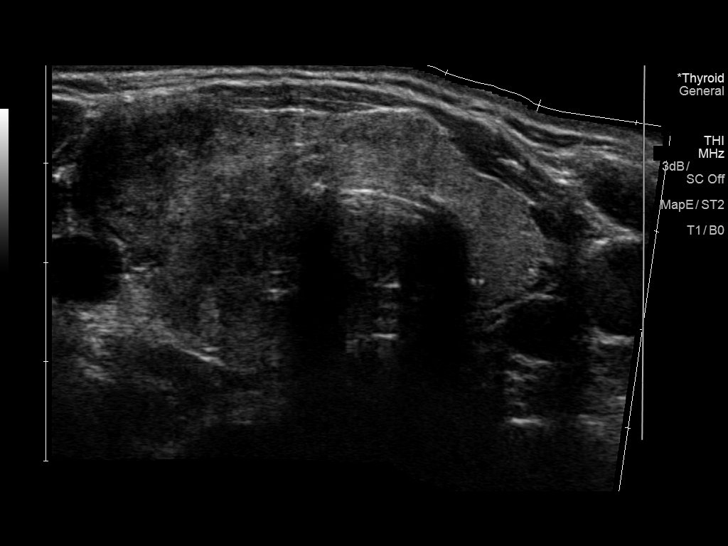
[im 4/47]
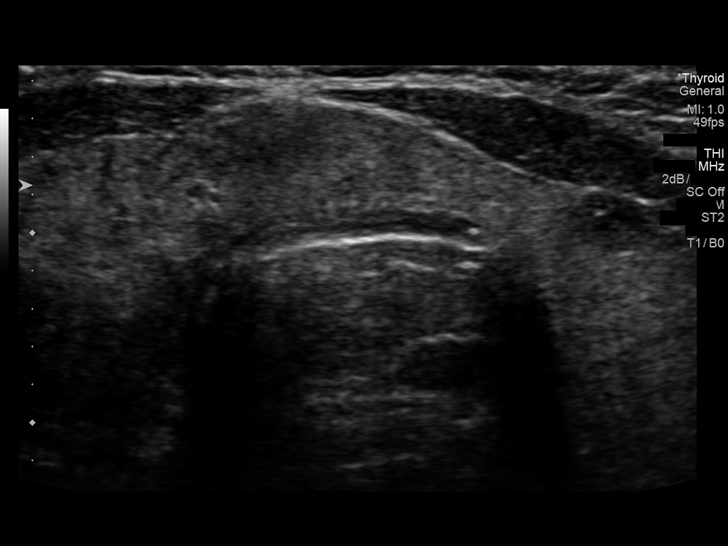
[im 8/47]
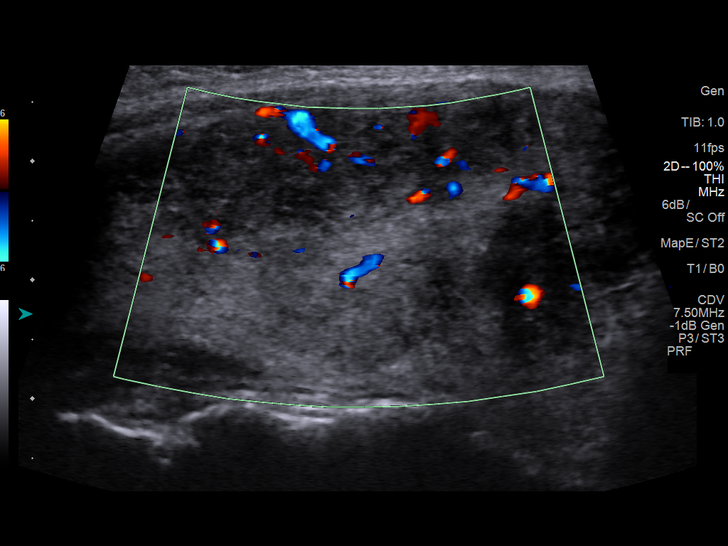
[im 12/47]
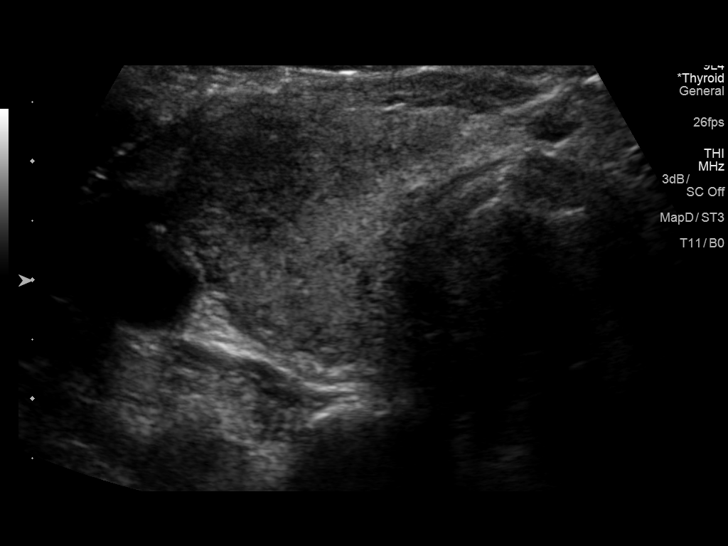
[im 16/47]
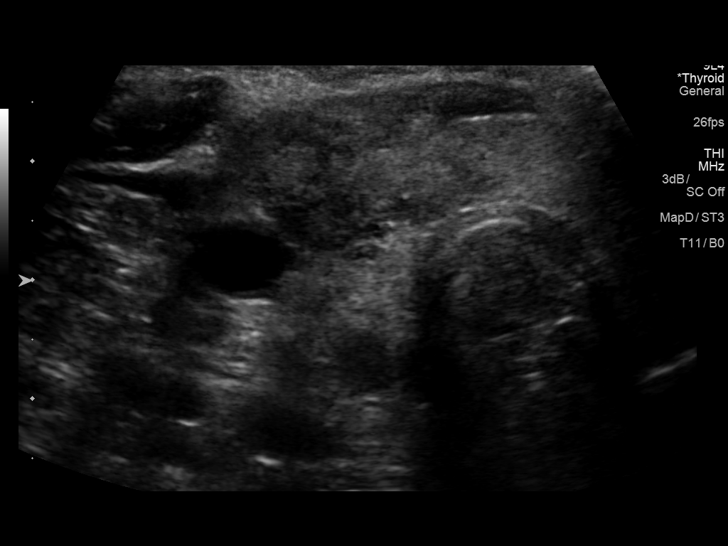
[im 18/47]
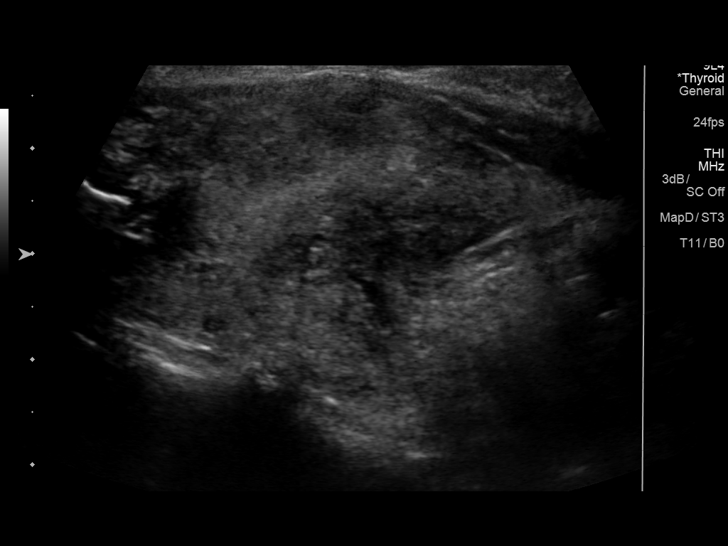
[im 22/47]
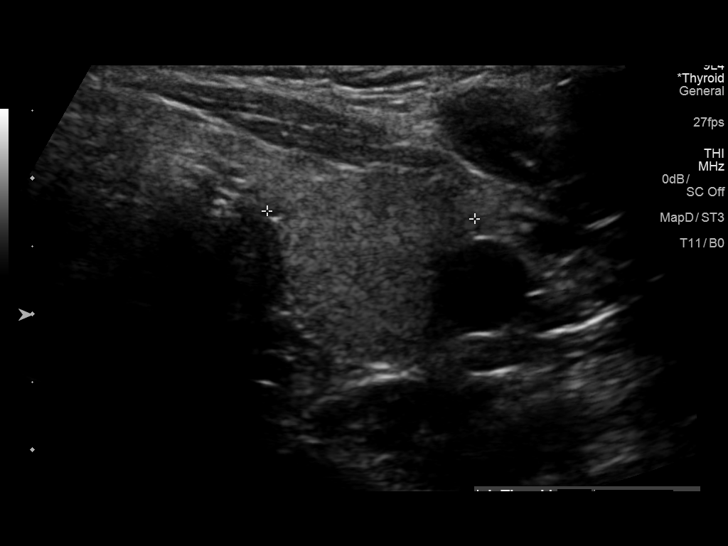
[im 25/47]
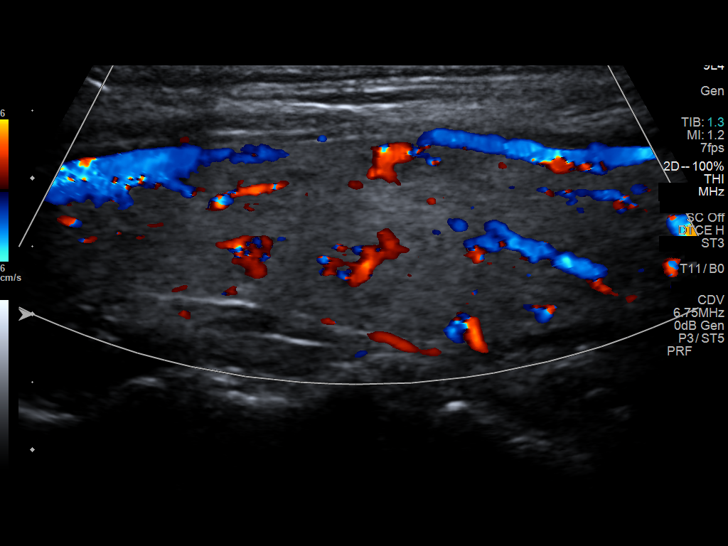
[im 29/47]
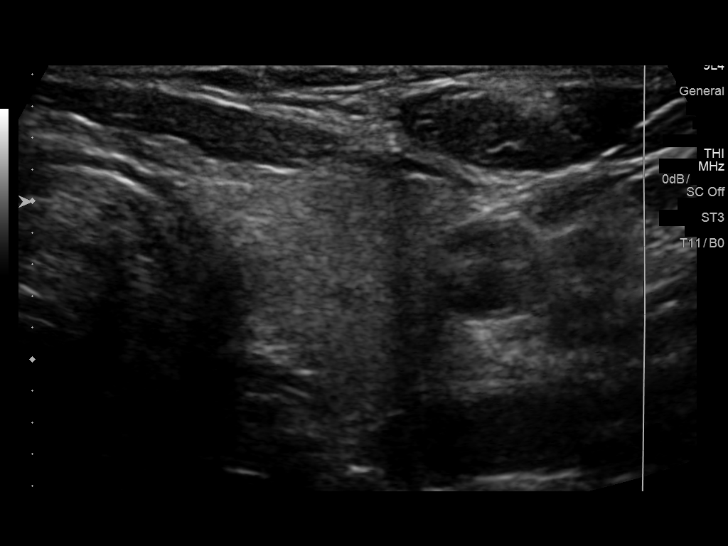
[im 31/47]
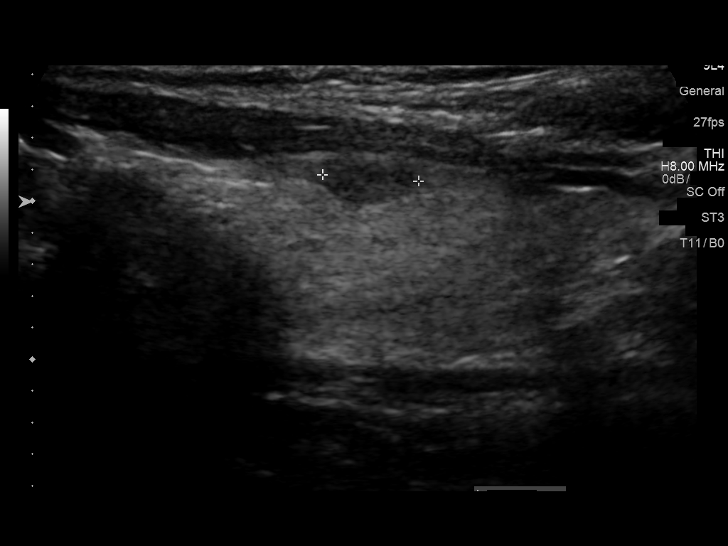
[im 35/47]
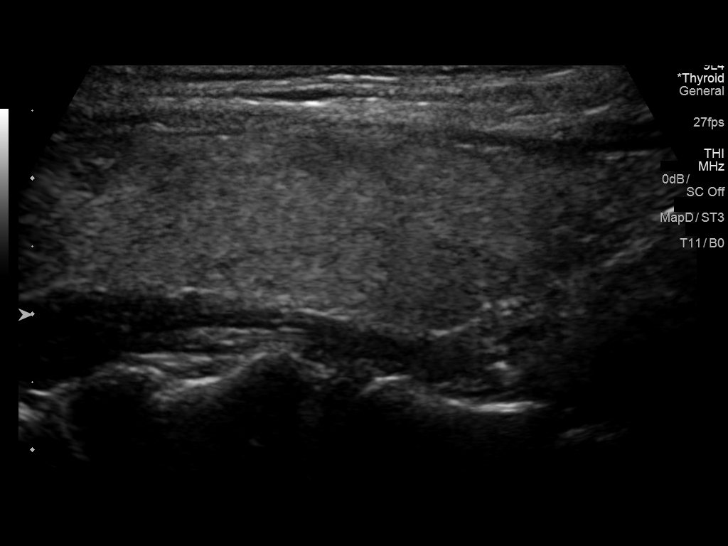
[im 39/47]
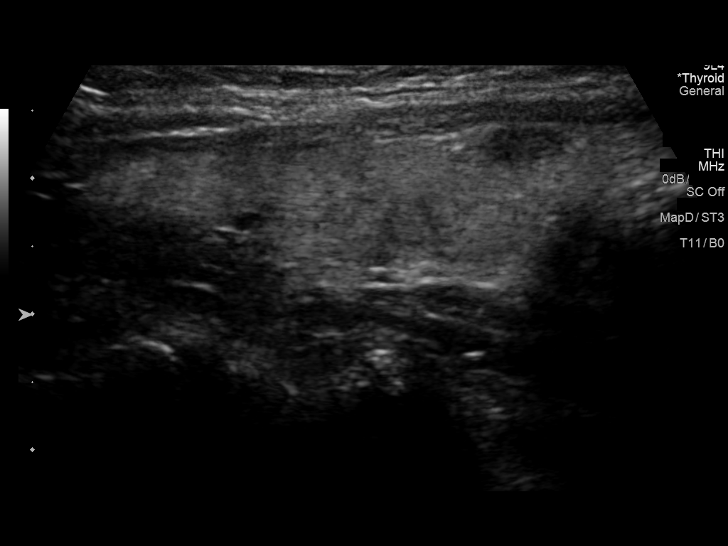
[im 43/47]
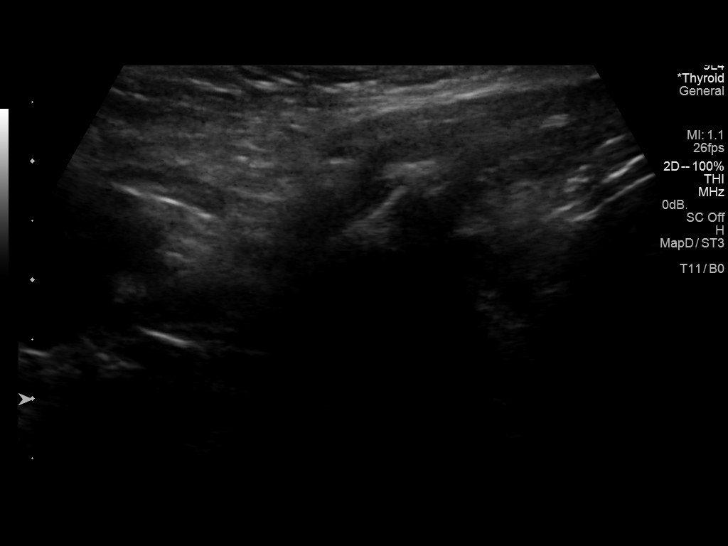
[im 47/47]
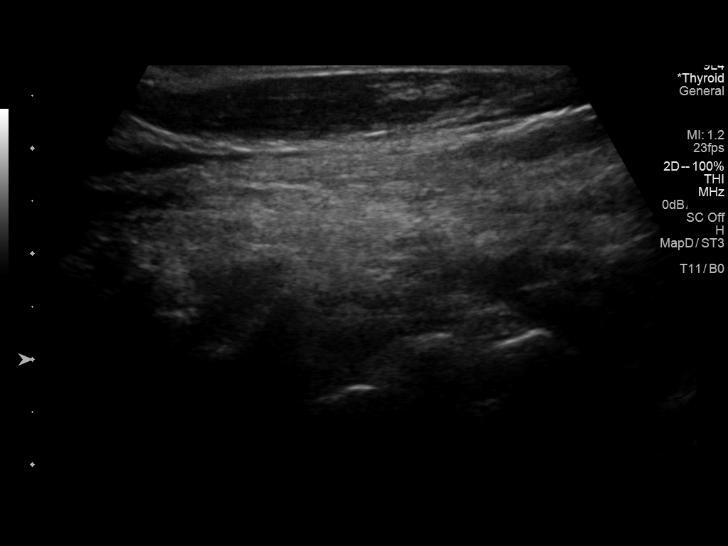

[14 of 25 positions shown; findings below may reference images not displayed]

FINDINGS: Parenchymal Echotexture: Moderately heterogenous

Isthmus: 0.6 cm thickness

Right lobe: 5.9 x 2.8 x 2 cm

Left lobe: 5.9 x 1.5 x 1.5 cm

_________________________________________________________

Estimated total number of nodules >/= 1 cm: 0

Number of spongiform nodules >/=  2 cm not described below (TR1): 0

Number of mixed cystic and solid nodules >/= 1.5 cm not described
below (TR2): 0

_________________________________________________________

0.6 cm hypoechoic nodule without microcalcifications, superficial
mid left lobe; This nodule does NOT meet TI-RADS criteria for biopsy
or dedicated follow-up.
IMPRESSION: 1. Borderline thyromegaly with single subcentimeter left nodule
which does not meet criteria for biopsy or dedicated imaging
follow-up.

The above is in keeping with the ACR TI-RADS recommendations - [HOSPITAL] 3848;[DATE].

## 2019-04-19 ENCOUNTER — Other Ambulatory Visit: Payer: Self-pay

## 2019-04-19 ENCOUNTER — Ambulatory Visit: Payer: BC Managed Care – PPO | Admitting: Podiatry

## 2019-04-19 ENCOUNTER — Encounter: Payer: Self-pay | Admitting: Podiatry

## 2019-04-19 VITALS — Temp 97.7°F

## 2019-04-19 DIAGNOSIS — M722 Plantar fascial fibromatosis: Secondary | ICD-10-CM

## 2019-04-19 NOTE — Progress Notes (Signed)
Presents today for follow-up of bilateral plantar fasciitis states that is doing much better the left one hurts in the arch little bit but she all in all she feels that she is progressively improving.  Objective: Vital signs are stable she is alert and oriented x3.  She has minimal pain on palpation medial calcaneal tubercles bilateral.  Mild tenderness on palpation of the medial band of the plantar fascia left foot.  Assessment: Well-healing plantar fasciitis.  Plan: Discussed appropriate shoe gear stretching exercise ice therapy shoe gear modifications she will continue her anti-inflammatories and all of her other conservative therapies including plantar fascial brace and night splint.  We will follow-up with her in 1 month.

## 2019-05-11 ENCOUNTER — Other Ambulatory Visit: Payer: Self-pay | Admitting: *Deleted

## 2019-05-11 MED ORDER — METHYLPREDNISOLONE 4 MG PO TBPK: ORAL_TABLET | ORAL | 0 refills | Status: DC

## 2019-05-11 NOTE — Telephone Encounter (Signed)
Requesting refill of Medrol Dose Pack-refill sent

## 2019-06-07 ENCOUNTER — Ambulatory Visit: Payer: BC Managed Care – PPO | Admitting: Podiatry

## 2019-07-14 ENCOUNTER — Ambulatory Visit: Payer: BC Managed Care – PPO | Admitting: Podiatry

## 2019-07-14 ENCOUNTER — Other Ambulatory Visit: Payer: Self-pay

## 2019-07-14 DIAGNOSIS — M722 Plantar fascial fibromatosis: Secondary | ICD-10-CM

## 2019-07-14 NOTE — Progress Notes (Signed)
She presents today for bilateral plantar fasciitis states that she is got better since she was here last time she wears her night splint every so often but has stopped wearing the plantar fascial brace because it started to rub her heel and cause numbness.  She states that she is about 80 to 85% improved at this point.  Objective: Vital signs are stable she is alert and oriented x3.  Pulses are palpable.  There is no erythema edema cellulitis drainage or odor she has pain on palpation medial calcaneal tubercle bilateral heels right greater than left.  Assessment: Plan fasciitis.  Plan: This point I will get her into a set of orthotics she saw Liliane Channel today for those and I will follow-up with her once those come in.

## 2019-08-15 ENCOUNTER — Other Ambulatory Visit: Payer: Self-pay

## 2019-08-15 ENCOUNTER — Ambulatory Visit: Payer: BC Managed Care – PPO | Admitting: Orthotics

## 2019-08-15 DIAGNOSIS — M722 Plantar fascial fibromatosis: Secondary | ICD-10-CM

## 2019-08-15 DIAGNOSIS — M67432 Ganglion, left wrist: Secondary | ICD-10-CM

## 2019-08-18 NOTE — Progress Notes (Signed)
Patient came in today to pick up custom made foot orthotics.  The goals were accomplished and the patient reported no dissatisfaction with said orthotics.  Patient was advised of breakin period and how to report any issues. 

## 2019-09-05 ENCOUNTER — Other Ambulatory Visit: Payer: Self-pay | Admitting: Cardiovascular Disease

## 2019-11-15 ENCOUNTER — Ambulatory Visit: Payer: BC Managed Care – PPO | Admitting: Cardiovascular Disease

## 2019-11-15 ENCOUNTER — Other Ambulatory Visit: Payer: Self-pay

## 2019-11-15 ENCOUNTER — Encounter: Payer: Self-pay | Admitting: Cardiovascular Disease

## 2019-11-15 VITALS — BP 124/82 | HR 56 | Ht 66.0 in | Wt 163.0 lb

## 2019-11-15 DIAGNOSIS — E782 Mixed hyperlipidemia: Secondary | ICD-10-CM | POA: Diagnosis not present

## 2019-11-15 DIAGNOSIS — I200 Unstable angina: Secondary | ICD-10-CM | POA: Diagnosis not present

## 2019-11-15 DIAGNOSIS — I209 Angina pectoris, unspecified: Secondary | ICD-10-CM

## 2019-11-15 MED ORDER — METOPROLOL TARTRATE 50 MG PO TABS: ORAL_TABLET | ORAL | 0 refills | Status: DC

## 2019-11-15 MED ORDER — OMEPRAZOLE 20 MG PO CPDR: 20.0000 mg | DELAYED_RELEASE_CAPSULE | Freq: Every day | ORAL | 0 refills | Status: DC

## 2019-11-15 MED ORDER — ASPIRIN EC 81 MG PO TBEC: 81.0000 mg | DELAYED_RELEASE_TABLET | Freq: Every day | ORAL | Status: DC

## 2019-11-15 MED ORDER — NITROGLYCERIN 0.4 MG SL SUBL: 0.4000 mg | SUBLINGUAL_TABLET | SUBLINGUAL | 6 refills | Status: AC | PRN

## 2019-11-15 NOTE — Patient Instructions (Addendum)
Medication Instructions:  Your physician has recommended you make the following change in your medication:  START Aspirin 81 mg once daily START Sublingual Nitroglycerin 0.4 mg - place one pill under your tongue if you have chest pain. If pain continues you may repeat 5 min later x 2 *If you need a refill on your cardiac medications before your next appointment, please call your pharmacy*   Lab Work: Your physician recommends that you return for lab work in: 1 week prior to Coronary CT You will need to FAST for this appointment - nothing to eat or drink after midnight the night before except water.  If you have labs (blood work) drawn today and your tests are completely normal, you will receive your results only by: Marland Kitchen MyChart Message (if you have MyChart) OR . A paper copy in the mail If you have any lab test that is abnormal or we need to change your treatment, we will call you to review the results.   Follow-Up: At [facility], you and your health needs are our priority.  As part of our continuing mission to provide you with exceptional heart care, we have created designated Provider Care Teams.  These Care Teams include your primary Cardiologist (physician) and Advanced Practice Providers (APPs -  Physician Assistants and Nurse Practitioners) who all work together to provide you with the care you need, when you need it.   Your next appointment:   3 month(s) on June 3  The format for your next appointment:   In Person  Provider:   Mertie Moores, MD   Testing/Procedures: Your cardiac CT will be scheduled at one of the below locations:   [facility] [address] [place], [place] [address] [phone]  [facility] [address] [place], [place] [address] [phone]  If scheduled at [facility], please arrive at the [facility] main entrance of [facility] 30 minutes prior to test start  time. Proceed to the [facility] Radiology Department (first floor) to check-in and test prep.  If scheduled at [facility], please arrive 15 mins early for check-in and test prep.  Please follow these instructions carefully (unless otherwise directed):  Hold all erectile dysfunction medications at least 3 days (72 hrs) prior to test.  On the Night Before the Test: . Be sure to Drink plenty of water. . Do not consume any caffeinated/decaffeinated beverages or chocolate 12 hours prior to your test. . Do not take any antihistamines 12 hours prior to your test. . If you take Metformin do not take 24 hours prior to test. . If the patient has contrast allergy: ? Patient will need a prescription for Prednisone and very clear instructions (as follows): 1. Prednisone 50 mg - take 13 hours prior to test 2. Take another Prednisone 50 mg 7 hours prior to test 3. Take another Prednisone 50 mg 1 hour prior to test 4. Take Benadryl 50 mg 1 hour prior to test . Patient must complete all four doses of above prophylactic medications. . Patient will need a ride after test due to Benadryl.  On the Day of the Test: . Drink plenty of water. Do not drink any water within one hour of the test. . Do not eat any food 4 hours prior to the test. . You may take your regular medications prior to the test.  . Take metoprolol (Lopressor) two hours prior to test. If HR  is less than 55 beats per minute, take 50 mg. If HR is greater than 55 beats per minute, take 100 mg.  . FEMALES- please wear underwire-free bra if available   Once we have confirmed authorization from your insurance company, we will call you to set up a date and time for your test.   For non-scheduling related questions, please contact the cardiac imaging nurse navigator should you have any questions/concerns: Marchia Bond, RN Navigator Cardiac Imaging [facility] [phone] mobile

## 2019-11-15 NOTE — Progress Notes (Signed)
Cardiology Office Note   Date:  11/15/2019   ID:  Kim Lee, Kim Lee Jul 15, 1966, MRN [id number]  PCP:  Jearld Fenton, NP  Cardiologist: formerly Darlin Coco MD, now [place]   Chief Complaint  Patient presents with  . Hyperlipidemia        Kim Lee is a 54 y.o. female who presents for 1 year follow-up office visit   This pleasant 54 year old woman has a history of hypercholesterolemia. She does not have any history of ischemic heart disease and she had a normal treadmill Cardiolite stress test 07/31/09. She exercises regularly and does some jogging. In November 2016 she recently had any half marathon down in [place].  He plans to do another half marathon this spring.. She has not been experiencing any chest pain. . The patient has a history of hypercholesterolemia and is on Lipitor.  She has been experiencing myalgias in her legs which she attributes to the Lipitor.  We will switch her to Crestor.She has a past history of anemia.  She takes Flintstones chewable multivitamins.  She is on a birth control pill to help with her heavy periods.  February 15, 2016:  Kim Lee is seen for the first time  - transfer from Dr. Mare Ferrari. She was changed from atorvastatin to Crestor last year. She was having lots of leg pain with the atorvastatin. She still has some leg pain although it seems to be tolerable. She does comment that the Crestor causes her to wake up every night at 3 AM. She's requested changing it to a morning dose.  Still running  - doing a [employer] run in [employer] .   She had a dizzy spell several weeks ago.  Went to [facility] the next day .  Work up was normal .   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 5, 2018:  [place] is seen For follow-up of her hyperlipidemia. No CP or dyspnea.  Still runs regularly  - doing a 1/2 marathon in several weeks .  Nov. 4, 2019:  Kim Lee  is seen today for follow-up of her hyperlipidemia.  She is had some atypical chest pain in the past.  She has a family history of premature coronary artery disease.  Ran a 1/2 marathon in Sept.  Had intrascapular pain .  Started at mile 7 and lasted until the rest of the race.  No dyspnea,   No dyspnea  Has run since then and has not had any episodes.  Tolerating the crestor but only takes it for several months before she comes for blood work   November 15, 2019  Kim Lee is seen today for follow up of her hyperlipidemia She has been very active. Has family hx of premature CAD  - mother had an MI at 22   Has some occasional leeft upper check pain  Has started back running .   Pains last  1-2 minutes She takes deep breaths whiich seems to help Radiates to mid chest and across her back - intrascapular ,  Down her left arm  These occur most times when she runs  Has been going on a month  Pains have not changed in severity   She runs 3 times a week .  Last cholesterol levels last Has been taking her rosuvastatin    Past Medical History:  Diagnosis Date  . Angina    2 years ago.  1  episode,  Sees Dr. Mare Ferrari  . Atypical chest pain    History of   . Family history of premature CAD   . Hypercholesterolemia   . Hyperlipidemia   . Left arm pain    History of   . Left shoulder pain    History of     Past Surgical History:  Procedure Laterality Date  . MASS EXCISION  07/30/2011   Procedure: EXCISION MASS;  Surgeon: Wynonia Sours, MD;  Location: [place];  Service: Orthopedics;  Laterality: Left;  left hand  . TUBAL LIGATION  2001     Current Outpatient Medications  Medication Sig Dispense Refill  . ezetimibe (ZETIA) 10 MG tablet TAKE 1 TABLET BY MOUTH DAILY 90 tablet 3  . progesterone (PROMETRIUM) 200 MG capsule Take 200 mg by mouth as directed.    . rosuvastatin (CRESTOR) 5 MG tablet TAKE 1 TABLET BY MOUTH DAILY 90 tablet 0  . aspirin EC 81 MG tablet Take 1  tablet (81 mg total) by mouth daily.    . metoprolol tartrate (LOPRESSOR) 50 MG tablet Take 1 or 2 pills as instructed 2 hours before your CT 2 tablet 0  . nitroGLYCERIN (NITROSTAT) 0.4 MG SL tablet Place 1 tablet (0.4 mg total) under the tongue every 5 (five) minutes as needed for chest pain. 25 tablet 6  . omeprazole (PRILOSEC) 20 MG capsule Take 1 capsule (20 mg total) by mouth daily. Take 1 pill each morning for 2 weeks 60 capsule 0   No current facility-administered medications for this visit.    Allergies:   Lipitor [atorvastatin]    Social History:  The patient  reports that she quit smoking about 20 years ago. Her smoking use included cigarettes. She has a 15.00 pack-year smoking history. She has never used smokeless tobacco. She reports current alcohol use. She reports that she does not use drugs.   Family History:  The patient's family history includes Cancer in her father, mother, sister, and sister; Coronary artery disease in her mother; Diabetes in her father; Hypertension in her mother and sister. her mother had multiple stents after age 15   ROS:  Please see the history of present illness.   Otherwise, review of systems are positive for none.   All other systems are reviewed and negative.    Physical Exam: Physical Exam: Blood pressure 124/82, pulse (!) 56, height 5\' 6"  (1.676 m), weight 163 lb (73.9 kg), SpO2 99 %.  GEN:  Well nourished, well developed in no acute distress HEENT: Normal NECK: No JVD; No carotid bruits LYMPHATICS: No lymphadenopathy CARDIAC: RRR, no murmurs, rubs, gallops RESPIRATORY:  Clear to auscultation without rales, wheezing or rhonchi  ABDOMEN: Soft, non-tender, non-distended MUSCULOSKELETAL:  No edema; No deformity  SKIN: Warm and dry NEUROLOGIC:  Alert and oriented x 3    EKG:    November 15, 2019: Normal sinus rhythm at 63.  No ST or T wave changes. Recent Labs: 04/07/2019: ALT 20; BUN 15; Creatinine, Ser 0.89; Hemoglobin 13.4; Platelets 271;  Potassium 4.3; Sodium 141    Lipid Panel    Component Value Date/Time   CHOL 128 01/12/2019 0739   TRIG 90 01/12/2019 0739   HDL 59 01/12/2019 0739   CHOLHDL 2.2 01/12/2019 0739   CHOLHDL 2.7 06/04/2016 0734   VLDL 22 06/04/2016 0734   LDLCALC 51 01/12/2019 0739      Wt Readings from Last 3 Encounters:  11/15/19 163 lb (73.9 kg)  04/07/19 156 lb (  70.8 kg)  09/22/18 156 lb (70.8 kg)         ASSESSMENT AND PLAN:  1.  Hypercholesterolemia -last lipid levels looked okay but these are couple years old.  We will repeat her labs later this week.  2.  Chest discomfort / unstable angina : The patient presents with episodes of chest pressure.  Her symptoms are consistent with unstable angina.  These tend to radiate through to between her shoulder blades and down her left arm.  She has a history of premature coronary artery disease.  Her mother had her first heart attack at age 20. I am concerned that these episodes might be angina.  We will get a coronary CT angiogram for further evaluation.  We will start her on aspirin 81 mg a day and I will give her a prescription for nitroglycerin.  We had a long discussion about what would prompt her to take nitroglycerin and what would prompt her to call 911.  I would have a very low threshold to proceed to heart catheterization.  I have instructed her to give me a call if these chest pains tend to worsen or last longer in duration.  This  office visit required a high level of complex  decision making.  I will see her again in 3 months.   Current medicines are reviewed at length with the patient today.  The patient does not have concerns regarding medicines.  The following changes have been made:  no change  Labs/ tests ordered today include:   Orders Placed This Encounter  Procedures  . CT CORONARY MORPH W/CTA COR W/SCORE W/CA W/CM &/OR WO/CM  . CT CORONARY FRACTIONAL FLOW RESERVE DATA PREP  . CT CORONARY FRACTIONAL FLOW RESERVE FLUID  ANALYSIS  . Lipid Profile  . Basic Metabolic Panel (BMET)  . Hepatic function panel  . EKG 12-Lead     Mertie Moores, MD  11/15/2019 [facility] [place],  [place], [place]  [address] Pager [phone] Phone: [phone]; Fax: [phone]

## 2019-11-24 ENCOUNTER — Other Ambulatory Visit: Payer: Self-pay

## 2019-11-24 ENCOUNTER — Other Ambulatory Visit: Payer: BC Managed Care – PPO

## 2019-11-24 DIAGNOSIS — I209 Angina pectoris, unspecified: Secondary | ICD-10-CM

## 2019-11-24 DIAGNOSIS — E782 Mixed hyperlipidemia: Secondary | ICD-10-CM

## 2019-11-24 LAB — HEPATIC FUNCTION PANEL
ALT: 15 IU/L (ref 0–32)
AST: 19 IU/L (ref 0–40)
Albumin: 4.8 g/dL (ref 3.8–4.9)
Alkaline Phosphatase: 54 IU/L (ref 39–117)
Bilirubin Total: 0.5 mg/dL (ref 0.0–1.2)
Bilirubin, Direct: 0.17 mg/dL (ref 0.00–0.40)
Total Protein: 6.8 g/dL (ref 6.0–8.5)

## 2019-11-24 LAB — LIPID PANEL
Chol/HDL Ratio: 2 ratio (ref 0.0–4.4)
Cholesterol, Total: 100 mg/dL (ref 100–199)
HDL: 50 mg/dL (ref >39)
LDL Chol Calc (NIH): 34 mg/dL (ref 0–99)
Triglycerides: 79 mg/dL (ref 0–149)
VLDL Cholesterol Cal: 16 mg/dL (ref 5–40)

## 2019-11-24 LAB — BASIC METABOLIC PANEL
BUN/Creatinine Ratio: 18 (ref 9–23)
BUN: 16 mg/dL (ref 6–24)
CO2: 23 mmol/L (ref 20–29)
Calcium: 9.3 mg/dL (ref 8.7–10.2)
Chloride: 105 mmol/L (ref 96–106)
Creatinine, Ser: 0.89 mg/dL (ref 0.57–1.00)
GFR calc Af Amer: 86 mL/min/{1.73_m2} (ref >59)
GFR calc non Af Amer: 74 mL/min/{1.73_m2} (ref >59)
Glucose: 95 mg/dL (ref 65–99)
Potassium: 4.3 mmol/L (ref 3.5–5.2)
Sodium: 141 mmol/L (ref 134–144)

## 2019-11-30 ENCOUNTER — Telehealth (HOSPITAL_COMMUNITY): Payer: Self-pay | Admitting: Emergency Medicine

## 2019-11-30 ENCOUNTER — Encounter (HOSPITAL_COMMUNITY): Payer: Self-pay

## 2019-11-30 NOTE — Telephone Encounter (Signed)
Left message on voicemail with name and callback number Erandy Mceachern RN Navigator Cardiac Imaging [facility] [phone] Office [phone] Cell  

## 2019-11-30 NOTE — Telephone Encounter (Signed)
Pt returning phone call regarding upcoming cardiac imaging study; pt verbalizes understanding of appt date/time, parking situation and where to check in, pre-test NPO status and medications ordered, and verified current allergies; name and call back number provided for further questions should they arise Marchia Bond RN Navigator Cardiac Imaging [facility] [phone] office [phone] cell  Last 2 EKGs showed HR <65bpm. I advised her not to take 2 tablets, just one if HR >60bpm. Pt verbalized understanding, appreciated the information  Clarise Cruz

## 2019-12-01 ENCOUNTER — Ambulatory Visit (HOSPITAL_COMMUNITY)
Admission: RE | Admit: 2019-12-01 | Discharge: 2019-12-01 | Disposition: A | Discharge status: Home / Self Care | Payer: BC Managed Care – PPO | Source: Ambulatory Visit | Attending: Cardiovascular Disease | Admitting: Cardiovascular Disease

## 2019-12-01 ENCOUNTER — Encounter: Payer: BC Managed Care – PPO | Admitting: *Deleted

## 2019-12-01 ENCOUNTER — Other Ambulatory Visit: Payer: Self-pay

## 2019-12-01 ENCOUNTER — Encounter (HOSPITAL_COMMUNITY): Payer: Self-pay

## 2019-12-01 DIAGNOSIS — Z006 Encounter for examination for normal comparison and control in clinical research program: Secondary | ICD-10-CM

## 2019-12-01 DIAGNOSIS — I200 Unstable angina: Secondary | ICD-10-CM | POA: Diagnosis present

## 2019-12-01 MED ORDER — NITROGLYCERIN 0.4 MG SL SUBL
SUBLINGUAL_TABLET | SUBLINGUAL | Status: AC
  Filled 2019-12-01: qty 2

## 2019-12-01 MED ORDER — IOHEXOL 350 MG/ML SOLN
100.0000 mL | Freq: Once | INTRAVENOUS | Status: AC | PRN
  Administered 2019-12-01: 100 mL via INTRAVENOUS

## 2019-12-01 MED ORDER — NITROGLYCERIN 0.4 MG SL SUBL
0.8000 mg | SUBLINGUAL_TABLET | Freq: Once | SUBLINGUAL | Status: AC
  Administered 2019-12-01: 0.8 mg via SUBLINGUAL

## 2019-12-01 NOTE — Research (Signed)
CADFEM Informed Consent   Subject Name: Kim Lee  Subject met inclusion and exclusion criteria.  The informed consent form, study requirements and expectations were reviewed with the subject and questions and concerns were addressed prior to the signing of the consent form.  The subject verbalized understanding of the trial requirements.  The subject agreed to participate in the CADFEM trial and signed the informed consent at [facility] on 12/01/2019.  The informed consent was obtained prior to performance of any protocol-specific procedures for the subject.  A copy of the signed informed consent was given to the subject and a copy was placed in the subject's medical record.   Oletta Cohn.

## 2019-12-02 ENCOUNTER — Other Ambulatory Visit: Payer: Self-pay | Admitting: Nurse Practitioner

## 2019-12-02 MED ORDER — ROSUVASTATIN CALCIUM 5 MG PO TABS: 5.0000 mg | ORAL_TABLET | Freq: Every day | ORAL | 3 refills | Status: DC

## 2019-12-02 MED ORDER — EZETIMIBE 10 MG PO TABS: 10.0000 mg | ORAL_TABLET | Freq: Every day | ORAL | 3 refills | Status: DC

## 2020-02-16 ENCOUNTER — Ambulatory Visit: Payer: BC Managed Care – PPO | Admitting: Cardiovascular Disease

## 2020-03-20 ENCOUNTER — Ambulatory Visit: Payer: BC Managed Care – PPO | Admitting: Podiatry

## 2020-03-20 ENCOUNTER — Other Ambulatory Visit: Payer: Self-pay

## 2020-03-20 ENCOUNTER — Encounter: Payer: Self-pay | Admitting: Podiatry

## 2020-03-20 DIAGNOSIS — M722 Plantar fascial fibromatosis: Secondary | ICD-10-CM | POA: Diagnosis not present

## 2020-03-20 MED ORDER — MELOXICAM 15 MG PO TABS: 15.0000 mg | ORAL_TABLET | Freq: Every day | ORAL | 3 refills | Status: DC

## 2020-03-20 MED ORDER — METHYLPREDNISOLONE 4 MG PO TBPK: ORAL_TABLET | ORAL | 0 refills | Status: DC

## 2020-03-20 NOTE — Progress Notes (Signed)
She presents today for follow-up of her plantar fasciitis states that been having issues again particularly right over here she points to the lateral aspect of her left heel.  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  States that it really hurts in the center and laterally as she points to the left foot.  States that she has been wearing her orthotics in her brace she tried her night splint and it was bothering her.  Objective: Vital signs are stable she is alert oriented x3.  Pulses are palpable.  There is no erythema edema cellulitis drainage or odor she has pain on palpation of the central band of the plantar fasciitis insertion site of the calcaneus.  She has some lateral compensatory syndrome noted of the lateral tubercle and fifth metatarsal as well as radiating of the lateral aspect of the Achilles.  Assessment: Plan fasciitis central and lateral bands.  Plan: Injected the central band today 20 mg Kenalog 5 mg Marcaine for maximal tenderness start her back on a Medrol Dosepak to be followed by meloxicam.  I will follow-up with her in 1 month.  We did discuss getting another pair of tennis shoes.  I think that her orthotics are still good enough shape she does not need to have them replaced.  I will follow-up with her in the near future.

## 2020-04-23 ENCOUNTER — Other Ambulatory Visit: Payer: Self-pay | Admitting: Endocrinology

## 2020-04-23 DIAGNOSIS — E041 Nontoxic single thyroid nodule: Secondary | ICD-10-CM

## 2020-04-30 ENCOUNTER — Other Ambulatory Visit: Payer: BC Managed Care – PPO

## 2020-05-10 ENCOUNTER — Ambulatory Visit
Admission: RE | Admit: 2020-05-10 | Discharge: 2020-05-10 | Disposition: A | Discharge status: Home / Self Care | Payer: BC Managed Care – PPO | Source: Ambulatory Visit | Attending: Endocrinology | Admitting: Endocrinology

## 2020-05-10 DIAGNOSIS — E041 Nontoxic single thyroid nodule: Secondary | ICD-10-CM

## 2020-10-19 ENCOUNTER — Other Ambulatory Visit: Payer: Self-pay

## 2020-10-19 ENCOUNTER — Ambulatory Visit: Payer: BC Managed Care – PPO | Admitting: Primary Care

## 2020-10-19 ENCOUNTER — Encounter: Payer: Self-pay | Admitting: Primary Care

## 2020-10-19 VITALS — BP 124/70 | HR 95 | Temp 98.2°F | Ht 66.0 in | Wt 151.4 lb

## 2020-10-19 DIAGNOSIS — R0789 Other chest pain: Secondary | ICD-10-CM | POA: Diagnosis not present

## 2020-10-19 LAB — CBC
HCT: 39.7 % (ref 36.0–46.0)
Hemoglobin: 13.3 g/dL (ref 12.0–15.0)
MCHC: 33.6 g/dL (ref 30.0–36.0)
MCV: 85.7 fl (ref 78.0–100.0)
Platelets: 273 10*3/uL (ref 150.0–400.0)
RBC: 4.64 Mil/uL (ref 3.87–5.11)
RDW: 11.9 % (ref 11.5–15.5)
WBC: 7.2 10*3/uL (ref 4.0–10.5)

## 2020-10-19 LAB — COMPREHENSIVE METABOLIC PANEL
ALT: 14 U/L (ref 0–35)
AST: 16 U/L (ref 0–37)
Albumin: 4.6 g/dL (ref 3.5–5.2)
Alkaline Phosphatase: 56 U/L (ref 39–117)
BUN: 15 mg/dL (ref 6–23)
CO2: 31 mEq/L (ref 19–32)
Calcium: 9.8 mg/dL (ref 8.4–10.5)
Chloride: 104 mEq/L (ref 96–112)
Creatinine, Ser: 0.87 mg/dL (ref 0.40–1.20)
GFR: 75.51 mL/min (ref >60.00)
Glucose, Bld: 89 mg/dL (ref 70–99)
Potassium: 4.4 mEq/L (ref 3.5–5.1)
Sodium: 140 mEq/L (ref 135–145)
Total Bilirubin: 0.6 mg/dL (ref 0.2–1.2)
Total Protein: 7.1 g/dL (ref 6.0–8.3)

## 2020-10-19 LAB — LIPID PANEL
Cholesterol: 130 mg/dL (ref 0–200)
HDL: 53.4 mg/dL (ref >39.00)
LDL Cholesterol: 57 mg/dL (ref 0–99)
NonHDL: 77.04
Total CHOL/HDL Ratio: 2
Triglycerides: 101 mg/dL (ref 0.0–149.0)
VLDL: 20.2 mg/dL (ref 0.0–40.0)

## 2020-10-19 LAB — TSH: TSH: 1.4 u[IU]/mL (ref 0.35–4.50)

## 2020-10-19 NOTE — Patient Instructions (Addendum)
Stop by the lab prior to leaving today. I will notify you of your results once received.   I recommend you contact your cardiologist for a sooner appointment.  It was a pleasure meeting you!

## 2020-10-19 NOTE — Assessment & Plan Note (Signed)
Unclear etiology but is concerning given symptoms and family history.   ECG today with NSR with rate of 66, no PAC/PVC, ST changes. ECG similar to ECG from 2021.   Checking labs today including TSH, CBC, CMP, Lipids. Recommended stretching to the shoulder/back. Also recommended she set up a follow up visit with her cardiologist for sooner than scheduled.  She appears well today, no distress.

## 2020-10-19 NOTE — Progress Notes (Signed)
Subjective:    Patient ID: Kim Lee, female    DOB: 24-Feb-1966, 55 y.o.   MRN: [id number]  HPI  This visit occurred during the SARS-CoV-2 public health emergency.  Safety protocols were in place, including screening questions prior to the visit, additional usage of staff PPE, and extensive cleaning of exam room while observing appropriate contact time as indicated for disinfecting solutions.   Ms. Nwosu is a 55 year old female patient of [facility] with a history of unstable angina, hyperlipidemia, thyroiditis who presents today with a chief complaint of back discomfort.   Her discomfort is located the the thoracic back behind her shoulder which radiates directly through to her left anterior chest. She has noticed short lived symptoms of left jaw pain, nausea, left lateral trunk pain, substernal chest fullness. Symptoms began a little more than a week ago and are sporadic, no pattern, occur at rest and exertion.   She ran 5 miles last Saturday and another 5 miles last Sunday, felt a little "sluggish". She denies decrease in ROM to her back, shoulders, upper extremities. She also denies recent injury/trauma, pain with deep inspiration, esophageal burning, numbness, cough.   Her "sensations" are intermittent. She underwent cardiac CT in March 2021 with calcium score of 0. She has an appointment with her cardiologist scheduled for next month.   She tried taking some Prilosec without improvement.   Review of Systems  Constitutional: Positive for fatigue. Negative for fever.  HENT: Negative for congestion.   Respiratory: Negative for cough and shortness of breath.   Cardiovascular:       Chest discomfort  Musculoskeletal: Negative for joint swelling.       Left upper thoracic back "discomfort"  Neurological: Negative for dizziness and headaches.  Psychiatric/Behavioral: The patient is not nervous/anxious.        Past Medical History:  Diagnosis Date  . Angina    2 years  ago.  1 episode,  Sees Dr. Mare Ferrari  . Atypical chest pain    History of   . Family history of premature CAD   . Hypercholesterolemia   . Hyperlipidemia   . Left arm pain    History of   . Left shoulder pain    History of      Social History   Socioeconomic History  . Marital status: Married    Spouse name: Not on file  . Number of children: Not on file  . Years of education: Not on file  . Highest education level: Not on file  Occupational History  . Not on file  Tobacco Use  . Smoking status: Former Smoker    Packs/day: 1.00    Years: 15.00    Pack years: 15.00    Types: Cigarettes    Quit date: 05/05/1999    Years since quitting: 21.4  . Smokeless tobacco: Never Used  Substance and Sexual Activity  . Alcohol use: Yes    Alcohol/week: 0.0 standard drinks    Comment: very rarely  . Drug use: No  . Sexual activity: Yes  Other Topics Concern  . Not on file  Social History Narrative  . Not on file   Social Determinants of Health   Financial Resource Strain: Not on file  Food Insecurity: Not on file  Transportation Needs: Not on file  Physical Activity: Not on file  Stress: Not on file  Social Connections: Not on file  Intimate Partner Violence: Not on file    Past Surgical History:  Procedure Laterality Date  . MASS EXCISION  07/30/2011   Procedure: EXCISION MASS;  Surgeon: Wynonia Sours, MD;  Location: [place];  Service: Orthopedics;  Laterality: Left;  left hand  . TUBAL LIGATION  2001    Family History  Problem Relation Age of Onset  . Diabetes Father   . Cancer Father        pancreatic  . Coronary artery disease Mother        with stents  . Hypertension Mother   . Cancer Mother        lung  . Cancer Sister        breast  . Cancer Sister        leukemia  . Hypertension Sister     Allergies  Allergen Reactions  . Lipitor [Atorvastatin] Anaphylaxis    LEG PAIN     Current Outpatient Medications on File Prior to Visit   Medication Sig Dispense Refill  . aspirin EC 81 MG tablet Take 1 tablet (81 mg total) by mouth daily.    Marland Kitchen ezetimibe (ZETIA) 10 MG tablet Take 1 tablet (10 mg total) by mouth daily. 90 tablet 3  . meloxicam (MOBIC) 15 MG tablet Take 1 tablet (15 mg total) by mouth daily. 30 tablet 3  . methylPREDNISolone (MEDROL DOSEPAK) 4 MG TBPK tablet 6 day dose pack - take as directed 21 tablet 0  . nitroGLYCERIN (NITROSTAT) 0.4 MG SL tablet Place 1 tablet (0.4 mg total) under the tongue every 5 (five) minutes as needed for chest pain. 25 tablet 6  . omeprazole (PRILOSEC) 20 MG capsule Take 1 capsule (20 mg total) by mouth daily. Take 1 pill each morning for 2 weeks 60 capsule 0  . progesterone (PROMETRIUM) 200 MG capsule Take 200 mg by mouth at bedtime.    . rosuvastatin (CRESTOR) 5 MG tablet Take 1 tablet (5 mg total) by mouth daily. 90 tablet 3   No current facility-administered medications on file prior to visit.    BP 124/70   Pulse 95   Temp 98.2 F (36.8 C) (Oral)   Ht 5\' 6"  (1.676 m)   Wt 151 lb 6.4 oz (68.7 kg)   SpO2 98%   BMI 24.44 kg/m    Objective:   Physical Exam Constitutional:      Appearance: She is well-nourished.  Cardiovascular:     Rate and Rhythm: Normal rate and regular rhythm.  Pulmonary:     Effort: Pulmonary effort is normal.     Breath sounds: Normal breath sounds.  Musculoskeletal:     Right shoulder: Normal. No tenderness or bony tenderness. Normal range of motion. Normal strength.     Left shoulder: Normal. No tenderness or bony tenderness. Normal range of motion. Normal strength.     Cervical back: Neck supple.  Skin:    General: Skin is warm and dry.  Psychiatric:        Mood and Affect: Mood and affect normal.            Assessment & Plan:

## 2020-10-21 ENCOUNTER — Encounter: Payer: Self-pay | Admitting: Primary Care

## 2020-11-28 ENCOUNTER — Ambulatory Visit (INDEPENDENT_AMBULATORY_CARE_PROVIDER_SITE_OTHER): Payer: BC Managed Care – PPO | Admitting: Internal Medicine

## 2020-11-28 ENCOUNTER — Ambulatory Visit (INDEPENDENT_AMBULATORY_CARE_PROVIDER_SITE_OTHER)
Admission: RE | Admit: 2020-11-28 | Discharge: 2020-11-28 | Disposition: A | Discharge status: Home / Self Care | Payer: BC Managed Care – PPO | Source: Ambulatory Visit | Attending: Internal Medicine | Admitting: Internal Medicine

## 2020-11-28 ENCOUNTER — Encounter: Payer: Self-pay | Admitting: Internal Medicine

## 2020-11-28 ENCOUNTER — Other Ambulatory Visit: Payer: Self-pay

## 2020-11-28 ENCOUNTER — Other Ambulatory Visit: Payer: Self-pay | Admitting: Internal Medicine

## 2020-11-28 VITALS — BP 116/78 | HR 69 | Temp 98.1°F | Wt 151.0 lb

## 2020-11-28 DIAGNOSIS — R0781 Pleurodynia: Secondary | ICD-10-CM | POA: Diagnosis not present

## 2020-11-28 NOTE — Patient Instructions (Signed)

## 2020-11-28 NOTE — Progress Notes (Signed)
Subjective:    Patient ID: Kim Lee, female    DOB: 08-Jan-1966, 55 y.o.   MRN: [id number]  HPI  Pt presents to the clinic today with c/o a mass under her left breast. She noticed this a few days ago. It is tender to touch but she has not noticed any redness, warmth or drainage from the area. She has not noticed any masses within the breast itself. She has not tried anything OTC for this.  Review of Systems  Past Medical History:  Diagnosis Date  . Angina    2 years ago.  1 episode,  Sees Dr. Mare Ferrari  . Atypical chest pain    History of   . Family history of premature CAD   . Hypercholesterolemia   . Hyperlipidemia   . Left arm pain    History of   . Left shoulder pain    History of     Current Outpatient Medications  Medication Sig Dispense Refill  . aspirin EC 81 MG tablet Take 1 tablet (81 mg total) by mouth daily.    Marland Kitchen ezetimibe (ZETIA) 10 MG tablet Take 1 tablet (10 mg total) by mouth daily. 90 tablet 3  . meloxicam (MOBIC) 15 MG tablet Take 1 tablet (15 mg total) by mouth daily. 30 tablet 3  . methylPREDNISolone (MEDROL DOSEPAK) 4 MG TBPK tablet 6 day dose pack - take as directed 21 tablet 0  . nitroGLYCERIN (NITROSTAT) 0.4 MG SL tablet Place 1 tablet (0.4 mg total) under the tongue every 5 (five) minutes as needed for chest pain. 25 tablet 6  . omeprazole (PRILOSEC) 20 MG capsule Take 1 capsule (20 mg total) by mouth daily. Take 1 pill each morning for 2 weeks 60 capsule 0  . progesterone (PROMETRIUM) 200 MG capsule Take 200 mg by mouth at bedtime.    . rosuvastatin (CRESTOR) 5 MG tablet Take 1 tablet (5 mg total) by mouth daily. 90 tablet 3   No current facility-administered medications for this visit.    Allergies  Allergen Reactions  . Lipitor [Atorvastatin] Anaphylaxis    LEG PAIN     Family History  Problem Relation Age of Onset  . Diabetes Father   . Cancer Father        pancreatic  . Coronary artery disease Mother        with stents  .  Hypertension Mother   . Cancer Mother        lung  . Cancer Sister        breast  . Cancer Sister        leukemia  . Hypertension Sister     Social History   Socioeconomic History  . Marital status: Married    Spouse name: Not on file  . Number of children: Not on file  . Years of education: Not on file  . Highest education level: Not on file  Occupational History  . Not on file  Tobacco Use  . Smoking status: Former Smoker    Packs/day: 1.00    Years: 15.00    Pack years: 15.00    Types: Cigarettes    Quit date: 05/05/1999    Years since quitting: 21.5  . Smokeless tobacco: Never Used  Substance and Sexual Activity  . Alcohol use: Yes    Alcohol/week: 0.0 standard drinks    Comment: very rarely  . Drug use: No  . Sexual activity: Yes  Other Topics Concern  . Not on file  Social  History Narrative  . Not on file   Social Determinants of Health   Financial Resource Strain: Not on file  Food Insecurity: Not on file  Transportation Needs: Not on file  Physical Activity: Not on file  Stress: Not on file  Social Connections: Not on file  Intimate Partner Violence: Not on file     Constitutional: Denies fever, malaise, fatigue, headache or abrupt weight changes.  Respiratory: Denies difficulty breathing, shortness of breath, cough or sputum production.   Cardiovascular: Denies chest pain, chest tightness, palpitations or swelling in the hands or feet.  Skin: Pt reports mass under her left breast. Denies redness, rashes, or ulcercations.    No other specific complaints in a complete review of systems (except as listed in HPI above).     Objective:   Physical Exam   BP 116/78   Pulse 69   Temp 98.1 F (36.7 C) (Temporal)   Wt 151 lb (68.5 kg)   SpO2 98%   BMI 24.37 kg/m   Wt Readings from Last 3 Encounters:  10/19/20 151 lb 6.4 oz (68.7 kg)  11/15/19 163 lb (73.9 kg)  04/07/19 156 lb (70.8 kg)    General: Appears her stated age, well developed,  well nourished in NAD. Skin: Warm, dry and intact. Cardiovascular: Normal rate. Pulmonary/Chest: Normal effort. MSK: Appears to be a hard bony nodule of the left anterior rib at the crease of the left breast. Breast: Symmetrical. Fibrocystic changes noted of the left breast but no discrete mass noted.  Neurological: Alert and oriented.   BMET    Component Value Date/Time   NA 140 10/19/2020 1041   NA 141 11/24/2019 0744   K 4.4 10/19/2020 1041   CL 104 10/19/2020 1041   CO2 31 10/19/2020 1041   GLUCOSE 89 10/19/2020 1041   BUN 15 10/19/2020 1041   BUN 16 11/24/2019 0744   CREATININE 0.87 10/19/2020 1041   CREATININE 0.77 06/04/2016 0734   CALCIUM 9.8 10/19/2020 1041   GFRNONAA 74 11/24/2019 0744   GFRAA 86 11/24/2019 0744    Lipid Panel     Component Value Date/Time   CHOL 130 10/19/2020 1041   CHOL 100 11/24/2019 0744   TRIG 101.0 10/19/2020 1041   HDL 53.40 10/19/2020 1041   HDL 50 11/24/2019 0744   CHOLHDL 2 10/19/2020 1041   VLDL 20.2 10/19/2020 1041   LDLCALC 57 10/19/2020 1041   LDLCALC 34 11/24/2019 0744    CBC    Component Value Date/Time   WBC 7.2 10/19/2020 1041   RBC 4.64 10/19/2020 1041   HGB 13.3 10/19/2020 1041   HGB 13.4 04/07/2019 1148   HCT 39.7 10/19/2020 1041   HCT 40.1 04/07/2019 1148   PLT 273.0 10/19/2020 1041   PLT 271 04/07/2019 1148   MCV 85.7 10/19/2020 1041   MCV 87 04/07/2019 1148   MCH 28.9 04/07/2019 1148   MCH 27.3 10/15/2015 0831   MCHC 33.6 10/19/2020 1041   RDW 11.9 10/19/2020 1041   RDW 13.2 04/07/2019 1148   LYMPHSABS 1.7 09/22/2018 1128   MONOABS 0.9 09/22/2018 1128   EOSABS 0.1 09/22/2018 1128   BASOSABS 0.1 09/22/2018 1128    Hgb A1C No results found for: HGBA1C          Assessment & Plan:   Left Anterior Rib Pain:  Will obtain xray of left side ribs If negative, consider diagnostic mammogram and ultrasound of the left breast  Will follow up after imaging, return precautions discussed  Webb Silversmith, NP This visit occurred during the SARS-CoV-2 public health emergency.  Safety protocols were in place, including screening questions prior to the visit, additional usage of staff PPE, and extensive cleaning of exam room while observing appropriate contact time as indicated for disinfecting solutions.

## 2020-11-30 ENCOUNTER — Encounter: Payer: Self-pay | Admitting: Internal Medicine

## 2020-11-30 DIAGNOSIS — R0781 Pleurodynia: Secondary | ICD-10-CM

## 2020-11-30 DIAGNOSIS — N6320 Unspecified lump in the left breast, unspecified quadrant: Secondary | ICD-10-CM

## 2020-12-07 ENCOUNTER — Ambulatory Visit: Payer: BC Managed Care – PPO | Admitting: Cardiovascular Disease

## 2020-12-26 ENCOUNTER — Encounter: Payer: Self-pay | Admitting: Cardiovascular Disease

## 2020-12-26 ENCOUNTER — Other Ambulatory Visit: Payer: Self-pay

## 2020-12-26 ENCOUNTER — Ambulatory Visit: Payer: BC Managed Care – PPO | Admitting: Cardiovascular Disease

## 2020-12-26 VITALS — BP 110/72 | HR 69 | Ht 66.0 in | Wt 153.8 lb

## 2020-12-26 DIAGNOSIS — E782 Mixed hyperlipidemia: Secondary | ICD-10-CM | POA: Diagnosis not present

## 2020-12-26 DIAGNOSIS — M940 Chondrocostal junction syndrome [Tietze]: Secondary | ICD-10-CM

## 2020-12-26 MED ORDER — EZETIMIBE 10 MG PO TABS: 10.0000 mg | ORAL_TABLET | Freq: Every day | ORAL | 3 refills | Status: DC

## 2020-12-26 MED ORDER — ROSUVASTATIN CALCIUM 5 MG PO TABS: 5.0000 mg | ORAL_TABLET | Freq: Every day | ORAL | 3 refills | Status: DC

## 2020-12-26 NOTE — Progress Notes (Signed)
Cardiology Office Note   Date:  12/26/2020   ID:  Kim Lee, Kim Lee [birth year], MRN [id number]  PCP:  Jearld Fenton, NP  Cardiologist: formerly Darlin Coco MD, now [place]   Chief Complaint  Patient presents with  . Hyperlipidemia        Kim Lee is a 55 y.o. female who presents for 1 year follow-up office visit   This pleasant 55 year old woman has a history of hypercholesterolemia. She does not have any history of ischemic heart disease and she had a normal treadmill Cardiolite stress test 07/31/09. She exercises regularly and does some jogging. In November 2016 she recently had any half marathon down in [place].  He plans to do another half marathon this spring.. She has not been experiencing any chest pain. . The patient has a history of hypercholesterolemia and is on Lipitor.  She has been experiencing myalgias in her legs which she attributes to the Lipitor.  We will switch her to Crestor.She has a past history of anemia.  She takes Flintstones chewable multivitamins.  She is on a birth control pill to help with her heavy periods.  February 15, 2016:  Kim Lee is seen for the first time  - transfer from Dr. Mare Ferrari. She was changed from atorvastatin to Crestor last year. She was having lots of leg pain with the atorvastatin. She still has some leg pain although it seems to be tolerable. She does comment that the Crestor causes her to wake up every night at 3 AM. She's requested changing it to a morning dose.  Still running  - doing a [employer] run in [employer] .   She had a dizzy spell several weeks ago.  Went to [facility] the next day .  Work up was normal .   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 5, 2018:  Kim Lee is seen For follow-up of her hyperlipidemia. No CP or dyspnea.  Still runs regularly  - doing a 1/2 marathon in several weeks .  Nov. 4,  2019:  Kim Lee is seen today for follow-up of her hyperlipidemia.  She is had some atypical chest pain in the past.  She has a family history of premature coronary artery disease.  Ran a 1/2 marathon in Sept.  Had intrascapular pain .  Started at mile 7 and lasted until the rest of the race.  No dyspnea,   No dyspnea  Has run since then and has not had any episodes.  Tolerating the crestor but only takes it for several months before she comes for blood work   November 15, 2019  Kim Lee is seen today for follow up of her hyperlipidemia She has been very active. Has family hx of premature CAD  - mother had an MI at 46   Has some occasional leeft upper check pain  Has started back running .   Pains last  1-2 minutes She takes deep breaths whiich seems to help Radiates to mid chest and across her back - intrascapular ,  Down her left arm  These occur most times when she runs  Has been going on a month  Pains have not changed in severity   She runs 3 times a week .  Last cholesterol levels last Has been taking her rosuvastatin   December 26, 2020:   Kim Lee is seen today for follow-up for follow-up for chest pain.  She  had a coronary CT angiogram which revealed a coronary calcium score of 0.  She had normal coronary arteries. Still having her CP .  Has not figured it out yet.  She has some chest wall tenderness.  I think that she probably has costochondritis.  We discussed having her take Motrin or Naprosyn. Has stopped running ( has some pevic support issues)  Has tried some advill which has helped   She is having some palpitations  Only last for a few seconds.  We discussed having her wear a monitor but she is not interested in having a monitor at this time.  She will let us know if these palpitations worsen.  Past Medical History:  Diagnosis Date  . Angina    2 years ago.  1 episode,  Sees Dr. Mare Ferrari  . Atypical chest pain    History of   . Family history of premature CAD   .  Hypercholesterolemia   . Hyperlipidemia   . Left arm pain    History of   . Left shoulder pain    History of     Past Surgical History:  Procedure Laterality Date  . MASS EXCISION  07/30/2011   Procedure: EXCISION MASS;  Surgeon: Wynonia Sours, MD;  Location: [facility];  Service: Orthopedics;  Laterality: Left;  left hand  . TUBAL LIGATION  2001     Current Outpatient Medications  Medication Sig Dispense Refill  . aspirin EC 81 MG tablet Take 1 tablet (81 mg total) by mouth daily.    . meloxicam (MOBIC) 15 MG tablet Take 1 tablet (15 mg total) by mouth daily. 30 tablet 3  . nitroGLYCERIN (NITROSTAT) 0.4 MG SL tablet Place 1 tablet (0.4 mg total) under the tongue every 5 (five) minutes as needed for chest pain. 25 tablet 6  . progesterone (PROMETRIUM) 200 MG capsule Take 200 mg by mouth as directed.    . ezetimibe (ZETIA) 10 MG tablet Take 1 tablet (10 mg total) by mouth daily. 90 tablet 3  . rosuvastatin (CRESTOR) 5 MG tablet Take 1 tablet (5 mg total) by mouth daily. 90 tablet 3   No current facility-administered medications for this visit.    Allergies:   Lipitor [atorvastatin]    Social History:  The patient  reports that she quit smoking about 21 years ago. Her smoking use included cigarettes. She has a 15.00 pack-year smoking history. She has never used smokeless tobacco. She reports current alcohol use. She reports that she does not use drugs.   Family History:  The patient's family history includes Cancer in her father, mother, sister, and sister; Coronary artery disease in her mother; Diabetes in her father; Hypertension in her mother and sister. her mother had multiple stents after age 39   ROS:  Please see the history of present illness.   Otherwise, review of systems are positive for none.   All other systems are reviewed and negative.    Physical Exam: Blood pressure 110/72, pulse 69, height 5\' 6"  (1.676 m), weight 153 lb 12.8 oz (69.8 kg), SpO2 96  %.  GEN:  Well nourished, well developed in no acute distress HEENT: Normal NECK: No JVD; No carotid bruits LYMPHATICS: No lymphadenopathy CARDIAC: RRR , no murmurs, rubs, gallops,  + chest wall tenderness.   C/w costochondritis  RESPIRATORY:  Clear to auscultation without rales, wheezing or rhonchi  ABDOMEN: Soft, non-tender, non-distended MUSCULOSKELETAL:  No edema; No deformity  SKIN: Warm and dry NEUROLOGIC:  Alert and oriented x 3  EKG:    December 26, 2020: Normal sinus rhythm at 69.  No ST or T wave abnormalities.  Recent Labs: 10/19/2020: ALT 14; BUN 15; Creatinine, Ser 0.87; Hemoglobin 13.3; Platelets 273.0; Potassium 4.4; Sodium 140; TSH 1.40    Lipid Panel    Component Value Date/Time   CHOL 130 10/19/2020 1041   CHOL 100 11/24/2019 0744   TRIG 101.0 10/19/2020 1041   HDL 53.40 10/19/2020 1041   HDL 50 11/24/2019 0744   CHOLHDL 2 10/19/2020 1041   VLDL 20.2 10/19/2020 1041   LDLCALC 57 10/19/2020 1041   LDLCALC 34 11/24/2019 0744      Wt Readings from Last 3 Encounters:  12/26/20 153 lb 12.8 oz (69.8 kg)  11/28/20 151 lb (68.5 kg)  10/19/20 151 lb 6.4 oz (68.7 kg)         ASSESSMENT AND PLAN:  1.  Hypercholesterolemia  : We will check labs on Monday.  Continue current medications.  2.  Chest discomfort: Coronary CT angiogram revealed a coronary calcium score of 0.  Her coronary arteries are normal.  She is very tender on her chest today and assess suspect she has costochondritis.  I have encouraged her to continue to take Motrin or other nonsteroidal anti-inflammatory agent.      I will see her again in 1 year.   Current medicines are reviewed at length with the patient today.  The patient does not have concerns regarding medicines.  The following changes have been made:  no change  Labs/ tests ordered today include:   Orders Placed This Encounter  Procedures  . ALT  . Basic metabolic panel  . Lipid panel     Mertie Moores, MD  12/26/2020  5:19 PM    [address],  [address], [place]  [phone] Pager [phone] Phone: [phone]; Fax: [phone]

## 2020-12-26 NOTE — Patient Instructions (Signed)
Medication Instructions:  Your physician recommends that you continue on your current medications as directed. Please refer to the Current Medication list given to you today.  *If you need a refill on your cardiac medications before your next appointment, please call your pharmacy*   Lab Work: 12/31/2020 lipids, alt, bmet    Testing/Procedures: none   Follow-Up: At [facility], you and your health needs are our priority.  As part of our continuing mission to provide you with exceptional heart care, we have created designated Provider Care Teams.  These Care Teams include your primary Cardiologist (physician) and Advanced Practice Providers (APPs -  Physician Assistants and Nurse Practitioners) who all work together to provide you with the care you need, when you need it.  Your next appointment:   1 year(s)  The format for your next appointment:   In Person  Provider:   You may see Mertie Moores, MD or one of the following Advanced Practice Providers on your designated Care Team:    Richardson Dopp, PA-C  [place], [place]

## 2020-12-27 NOTE — Addendum Note (Signed)
Addended by: Georgiann Cocker on: 12/27/2020 04:18 PM   Modules accepted: Orders

## 2020-12-31 ENCOUNTER — Other Ambulatory Visit: Payer: BC Managed Care – PPO | Admitting: *Deleted

## 2020-12-31 DIAGNOSIS — E782 Mixed hyperlipidemia: Secondary | ICD-10-CM

## 2020-12-31 LAB — ALT: ALT: 16 IU/L (ref 0–32)

## 2021-01-04 ENCOUNTER — Other Ambulatory Visit: Payer: Self-pay

## 2021-01-04 ENCOUNTER — Other Ambulatory Visit: Payer: BC Managed Care – PPO | Admitting: *Deleted

## 2021-01-04 DIAGNOSIS — E782 Mixed hyperlipidemia: Secondary | ICD-10-CM

## 2021-01-04 LAB — LIPID PANEL
Chol/HDL Ratio: 3.2 ratio (ref 0.0–4.4)
Cholesterol, Total: 189 mg/dL (ref 100–199)
HDL: 59 mg/dL (ref >39)
LDL Chol Calc (NIH): 113 mg/dL — ABNORMAL HIGH (ref 0–99)
Triglycerides: 93 mg/dL (ref 0–149)
VLDL Cholesterol Cal: 17 mg/dL (ref 5–40)

## 2021-01-04 LAB — BASIC METABOLIC PANEL
BUN/Creatinine Ratio: 20 (ref 9–23)
BUN: 16 mg/dL (ref 6–24)
CO2: 23 mmol/L (ref 20–29)
Calcium: 9.4 mg/dL (ref 8.7–10.2)
Chloride: 105 mmol/L (ref 96–106)
Creatinine, Ser: 0.8 mg/dL (ref 0.57–1.00)
Glucose: 100 mg/dL — ABNORMAL HIGH (ref 65–99)
Potassium: 4.5 mmol/L (ref 3.5–5.2)
Sodium: 143 mmol/L (ref 134–144)
eGFR: 88 mL/min/{1.73_m2} (ref >59)

## 2021-01-07 ENCOUNTER — Other Ambulatory Visit: Payer: Self-pay | Admitting: Obstetrics and Gynecology

## 2021-01-07 DIAGNOSIS — Z803 Family history of malignant neoplasm of breast: Secondary | ICD-10-CM

## 2021-01-25 ENCOUNTER — Other Ambulatory Visit: Payer: Self-pay

## 2021-02-26 IMAGING — CT CT HEART MORP W/ CTA COR W/ SCORE W/ CA W/CM &/OR W/O CM
4 of 7 series · 8 of 20 positions shown, 9 images · IV contrast (APPLIED)
Comparison: 07/21/2009 chest radiograph.  No prior CT.
COMPARISON: 07/21/2009 chest radiograph.  No prior CT.

Addendum:
EXAM:
OVER-READ INTERPRETATION  CT CHEST

The following report is an over-read performed by radiologist Dr.
Quanda Celi [REDACTED] on 12/01/2019. This over-read
does not include interpretation of cardiac or coronary anatomy or
pathology. The coronary CTA interpretation by the cardiologist is
attached.
TECHNIQUE: The patient was scanned on a Phillips Force scanner.

[Series 6: best diast 75 % · axial · 0.39mm/px · z∈[+1161,+1205]mm · 2 of 328 slices shown, 3 images]
[im 110/328  vessel]
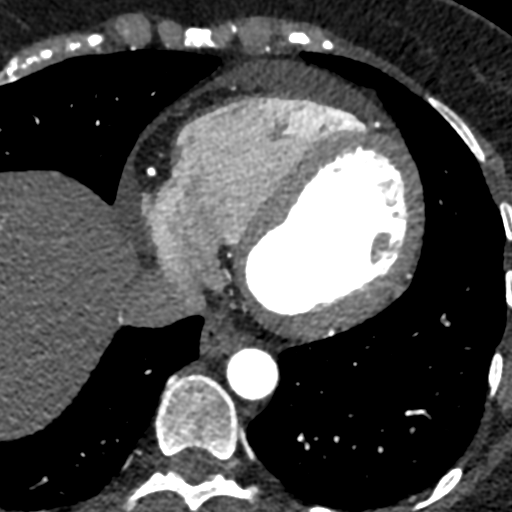
[im 110/328  lung]
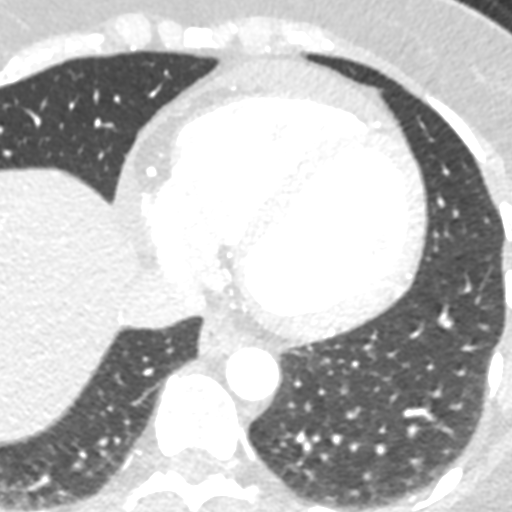
[im 219/328  vessel]
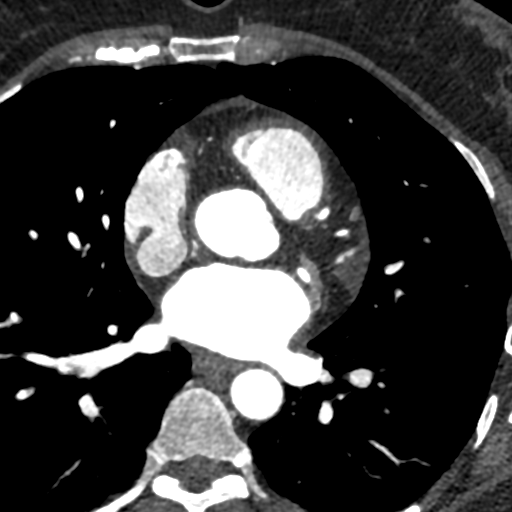

[Series 7: best syst 33 % · axial · 0.39mm/px · z∈[+1161,+1205]mm · 2 of 328 slices shown]
[im 110/328  vessel]
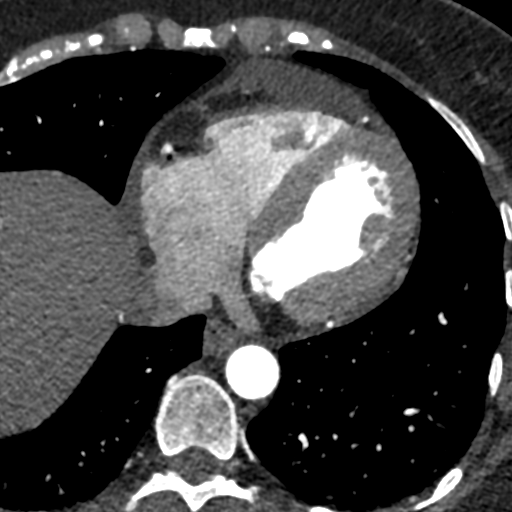
[im 219/328  vessel]
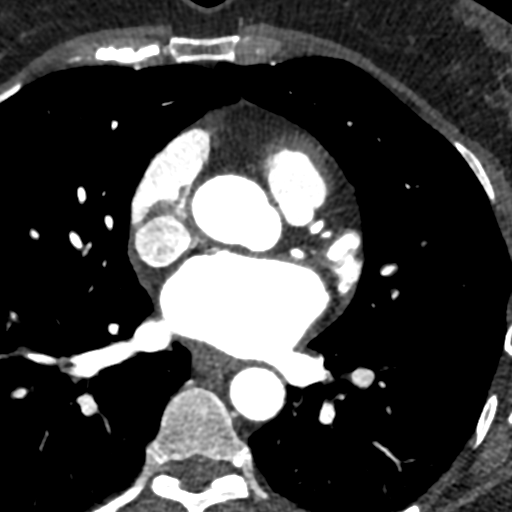

[Series 8: ts diast sharp 33 % · axial · 0.39mm/px · z∈[+1161,+1205]mm · 2 of 328 slices shown]
[im 110/328  lung]
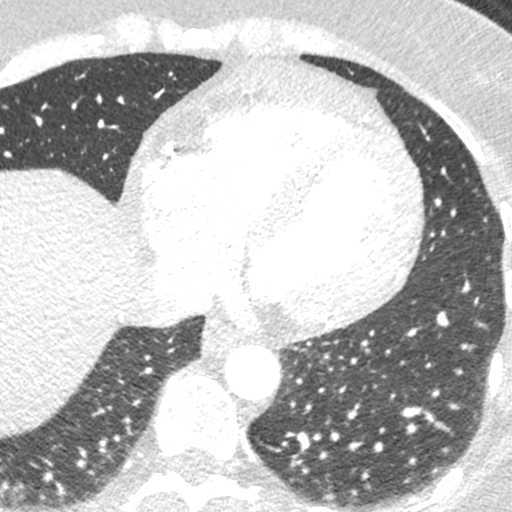
[im 219/328  lung]
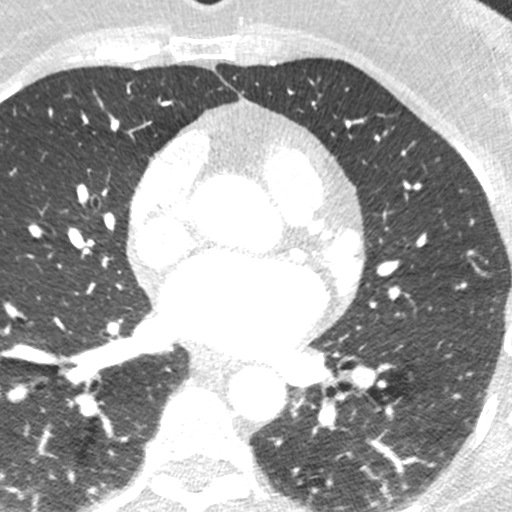

[Series 9: ts syst sharp 33 % · axial · 0.39mm/px · z∈[+1161,+1205]mm · 2 of 328 slices shown]
[im 110/328  lung]
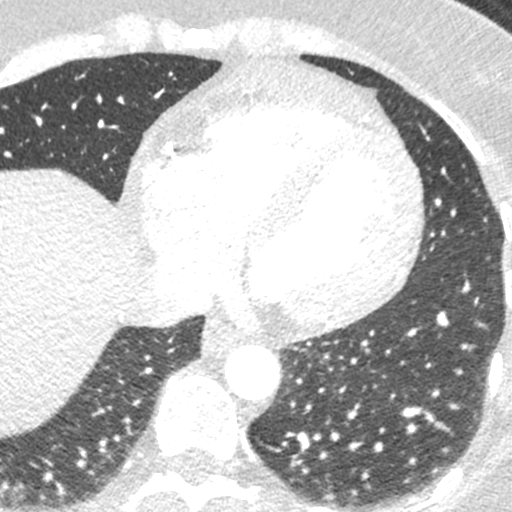
[im 219/328  lung]
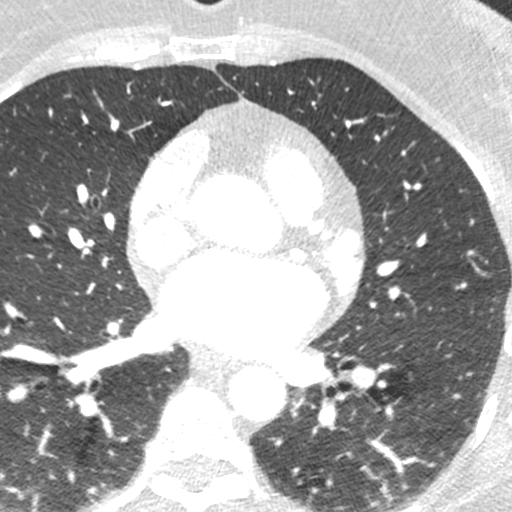

[8 of 20 positions shown; findings below may reference images not displayed]

FINDINGS: Vascular: Normal aortic caliber. No dissection. No central pulmonary
embolism, on this non-dedicated study.

Mediastinum/Nodes: No imaged thoracic adenopathy.

Lungs/Pleura: No pleural fluid.  Clear imaged lungs.

Upper Abdomen: Normal imaged portions of the liver, spleen, stomach,
left kidney.

Musculoskeletal: No acute osseous abnormality.
IMPRESSION: No acute findings in the imaged extracardiac chest.

EXAM:
Cardiac/Coronary  CT
FINDINGS: A 120 kV prospective scan was triggered in the descending thoracic
aorta at 111 HU's. Axial non-contrast 3 mm slices were carried out
through the heart. The data set was analyzed on a dedicated work
station and scored using the Agatson method. Gantry rotation speed
was 250 msecs and collimation was .6 mm. No beta blockade and 0.8 mg
of sl NTG was given. The 3D data set was reconstructed in 5%
intervals of the 67-82 % of the R-R cycle. Diastolic phases were
analyzed on a dedicated work station using MPR, MIP and VRT modes.
The patient received 80 cc of contrast.

Aorta:  Normal size.  No calcifications.  No dissection.

Aortic Valve:  Trileaflet.  No calcifications.

Coronary Arteries:  Normal coronary origin.  Right dominance.

RCA is a large dominant artery that gives rise to PDA and PLVB.
There is no plaque.

Left main is a large artery that gives rise to LAD and LCX arteries.
There is no plaque.

LAD is a large vessel that gives rise to a large D1. There is no
plaque.

LCX is a non-dominant artery that gives rise to one large OM1 branch
and then terminates in a large OM2 branch. There is no plaque.

Other findings:

Normal pulmonary vein drainage into the left atrium.

Normal let atrial appendage without a thrombus.

Normal size of the pulmonary artery.
IMPRESSION: 1. Coronary calcium score of 0. This was 0 percentile for age and
sex matched control.

2.  Normal coronary origin with right dominance.

3.  No evidence of CAD.  CAD-RADS 0.

4.  Consider non atherosclerotic causes of chest pain.

Clint Carro

*** End of Addendum ***
EXAM:
OVER-READ INTERPRETATION  CT CHEST

The following report is an over-read performed by radiologist Dr.
Quanda Celi [REDACTED] on 12/01/2019. This over-read
does not include interpretation of cardiac or coronary anatomy or
pathology. The coronary CTA interpretation by the cardiologist is
attached.
FINDINGS: Vascular: Normal aortic caliber. No dissection. No central pulmonary
embolism, on this non-dedicated study.

Mediastinum/Nodes: No imaged thoracic adenopathy.

Lungs/Pleura: No pleural fluid.  Clear imaged lungs.

Upper Abdomen: Normal imaged portions of the liver, spleen, stomach,
left kidney.

Musculoskeletal: No acute osseous abnormality.
IMPRESSION: No acute findings in the imaged extracardiac chest.

## 2021-03-18 NOTE — H&P (Signed)
Patient Kim Lee, Kim Lee DICTATION# [id number] CSN# [id number]  [name], MD 03/18/2021 7:19 AM

## 2021-03-19 ENCOUNTER — Encounter (HOSPITAL_BASED_OUTPATIENT_CLINIC_OR_DEPARTMENT_OTHER): Payer: Self-pay | Admitting: Obstetrics and Gynecology

## 2021-03-19 NOTE — H&P (Signed)
sNAME: Kim Lee, Kim Lee MEDICAL RECORD NO: [id number] ACCOUNT NO: [id number] DATE OF BIRTH: [birth year] PHYSICIAN: Darlyn Chamber, MD  History and Physical   DATE OF ADMISSION: 03/26/2021  HISTORY OF PRESENT ILLNESS:  The patient is a 55 year old gravida 52, para 4 postmenopausal patient, presents for laparoscopic-assisted vaginal hysterectomy with bilateral salpingo-oophorectomy, anterior and posterior repair along with sacrospinous  ligament suspension.  The patient has been watched with worsening symptomatic pelvic relaxation.  She has undergone previous sonogram now presents for the above noted surgery as noted.  ALLERGIES:  ALLERGIC TO lipitor.  MEDICATIONS: 1.  estradiol cream. 2.  Ezetimibe 10 mg tablet daily. 3.  Micronized progesterone 200 mg day 1 through 12. 4.  Rosuvastatin 5 mg tablet daily. 5.  Zetia.  PAST MEDICAL HISTORY:  Usual childhood diseases without any significant sequelae.  The patient is being managed for hyperlipidemia.  PAST SURGICAL HISTORY:  She has had 4 vaginal deliveries, 2 miscarriages.  Did have previous bilateral tubal ligation.  Again, she is being managed for hyperlipidemia as well as history of thyroiditis.  SOCIAL HISTORY:  Reveals no tobacco or alcohol use.  FAMILY HISTORY:  Noncontributory.  REVIEW OF SYSTEMS:  Noncontributory.  PHYSICAL EXAMINATION: VITAL SIGNS:  The patient is afebrile with stable vital signs. HEENT:  The patient is normocephalic.  Pupils equal, round, reactive to light and accommodation.  Extraocular movements are intact.  Sclerae and conjunctivae are clear.  Oropharynx clear. NECK:  Without thyromegaly. BREASTS:  No discrete masses. LUNGS:  Clear. CARDIOVASCULAR:  Regular rhythm and rate without murmurs or gallops. ABDOMEN:  Benign.  No masses, organomegaly, or tenderness. PELVIC: Normal external genitalia.  Vaginal mucosa reveals moderate uterine descensus with associated cystocele and rectocele.  Uterus normal  size, shape, and contour.  Adnexa free of masses or tenderness. EXTREMITIES:  Trace edema. NEUROLOGIC:  Grossly normal limits.  IMPRESSION:   1.  Worsening pelvic relaxation. 2.  Hyperlipidemia. 3.  History of thyroiditis.  PLAN:  The patient to undergo above noted surgery.  The risks of surgery have been discussed including the risk of infection.  The risk of hemorrhage that could require transfusion with risk of AIDS or hepatitis.  Risk of injury to adjacent organs such  as bladder, bowel or ureters that could require further exploratory surgery.  Risk of deep venous thrombosis and pulmonary embolus.  The patient expressed understanding of indication and risk and alternatives.   SHW D: 03/18/2021 7:18:44 am T: 03/18/2021 7:36:00 am  JOB: [id number]/ [id number]

## 2021-03-21 ENCOUNTER — Encounter (HOSPITAL_BASED_OUTPATIENT_CLINIC_OR_DEPARTMENT_OTHER): Payer: Self-pay | Admitting: Obstetrics and Gynecology

## 2021-03-21 ENCOUNTER — Other Ambulatory Visit: Payer: Self-pay

## 2021-03-21 DIAGNOSIS — Z973 Presence of spectacles and contact lenses: Secondary | ICD-10-CM

## 2021-03-21 HISTORY — DX: Presence of spectacles and contact lenses: Z97.3

## 2021-03-21 NOTE — Progress Notes (Signed)
YOU ARE SCHEDULED FOR A COVID TEST ON    03-22-2021 at 805 am. THIS TEST MUST BE DONE BEFORE SURGERY. GO TO  [facility] [place], [place], IT IS APPROXIMATELY 2 MINUTES PAST [employer] ON THE RIGHT AND REMAIN IN YOUR CAR, THIS IS A DRIVE UP TEST.       Your procedure is scheduled on 03-26-2021  Report to [facility]   Call this number if you have problems the morning of surgery  :[phone].   OUR ADDRESS IS [address].  WE ARE LOCATED IN [facility].  PLEASE BRING YOUR INSURANCE CARD AND PHOTO ID DAY OF SURGERY.  ONLY ONE PERSON ALLOWED IN FACILITY WAITING AREA.                                     REMEMBER:  DO NOT EAT FOOD, CANDY GUM OR MINTS  AFTER MIDNIGHT . YOU MAY HAVE CLEAR LIQUIDS FROM MIDNIGHT UNTIL 430 AM. NO CLEAR LIQUIDS AFTER 430 AM DAY OF SURGERY.   YOU MAY  BRUSH YOUR TEETH MORNING OF SURGERY AND RINSE YOUR MOUTH OUT, NO CHEWING GUM CANDY OR MINTS.    CLEAR LIQUID DIET   Foods Allowed                                                                     Foods Excluded  Coffee and tea, regular and decaf                             liquids that you cannot  Plain Jell-O any favor except red or purple                                           see through such as: Fruit ices (not with fruit pulp)                                     milk, soups, orange juice  Iced Popsicles                                    All solid food Carbonated beverages, regular and diet                                    Cranberry, grape and apple juices Sports drinks like Gatorade Lightly seasoned clear broth or consume(fat free) Sugar, honey syrup  Sample Menu Breakfast                                Lunch  Supper Cranberry juice                    Beef broth                            Chicken broth Jell-O                                     Grape juice                           Apple  juice Coffee or tea                        Jell-O                                      Popsicle                                                Coffee or tea                        Coffee or tea  _____________________________________________________________________     TAKE THESE MEDICATIONS MORNING OF SURGERY WITH A SIP OF WATER:  NONE  ONE VISITOR IS ALLOWED IN WAITING ROOM ONLY DAY OF SURGERY.  NO VISITOR MAY SPEND THE NIGHT.  VISITOR ARE ALLOWED TO STAY UNTIL 800 PM.                                    DO NOT WEAR JEWERLY, MAKE UP. DO NOT WEAR LOTIONS, POWDERS, PERFUMES OR NAIL POLISH. DO NOT SHAVE FOR 48 HOURS PRIOR TO DAY OF SURGERY. MEN MAY SHAVE FACE AND NECK. CONTACTS, GLASSES, OR DENTURES MAY NOT BE WORN TO SURGERY.                                    [facility] IS NOT RESPONSIBLE  FOR ANY BELONGINGS.                                                                    Marland Kitchen           [facility] - Preparing for Surgery Before surgery, you can play an important role.  Because skin is not sterile, your skin needs to be as free of germs as possible.  You can reduce the number of germs on your skin by washing with CHG (chlorahexidine gluconate) soap before surgery.  CHG is an antiseptic cleaner which kills germs and bonds with the skin to continue killing germs even after washing. Please DO NOT use if you have an allergy to CHG or antibacterial soaps.  If your skin becomes reddened/irritated  stop using the CHG and inform your nurse when you arrive at Short Stay. Do not shave (including legs and underarms) for at least 48 hours prior to the first CHG shower.  You may shave your face/neck. Please follow these instructions carefully:  1.  Shower with CHG Soap the night before surgery and the  morning of Surgery.  2.  If you choose to wash your hair, wash your hair first as usual with your  normal  shampoo.  3.  After you shampoo, rinse your hair and body thoroughly to remove the  shampoo.                             4.  Use CHG as you would any other liquid soap.  You can apply chg directly  to the skin and wash                      Gently with a scrungie or clean washcloth.  5.  Apply the CHG Soap to your body ONLY FROM THE NECK DOWN.   Do not use on face/ open                           Wound or open sores. Avoid contact with eyes, ears mouth and genitals (private parts).                       Wash face,  Genitals (private parts) with your normal soap.             6.  Wash thoroughly, paying special attention to the area where your surgery  will be performed.  7.  Thoroughly rinse your body with warm water from the neck down.  8.  DO NOT shower/wash with your normal soap after using and rinsing off  the CHG Soap.                9.  Pat yourself dry with a clean towel.            10.  Wear clean pajamas.            11.  Place clean sheets on your bed the night of your first shower and do not  sleep with pets. Day of Surgery : Do not apply any lotions/deodorants the morning of surgery.  Please wear clean clothes to the hospital/surgery center.  FAILURE TO FOLLOW THESE INSTRUCTIONS MAY RESULT IN THE CANCELLATION OF YOUR SURGERY PATIENT SIGNATURE_________________________________  NURSE SIGNATURE__________________________________  ________________________________________________________________________                                                        QUESTIONS Hansel Feinstein PRE OP NURSE PHONE [phone].

## 2021-03-21 NOTE — Progress Notes (Signed)
Spoke w/ via phone for pre-op interview---PT Lab needs dos---- URINE POCT              Lab results------has lab appt 03-22-2021 at 910 for cbc cmp t & s COVID test -----03-22-2021 805 (overnight stay) Arrive 03-26-2021 at 530 am NPO after MN NO Solid Food.  Clear liquids from MN until---430 am then npo Med rec completed Medications to take morning of surgery -----none Diabetic medication -----n/a Patient instructed no nail polish to be worn day of surgery Patient instructed to bring photo id and insurance card day of surgery Patient aware to have Driver (ride ) / caregiver  spouse jeffrey   for 24 hours after surgery  Patient Special Instructions -----pt given overnight stay instructions Pre-Op special Istructions -----none Patient verbalized understanding of instructions that were given at this phone interview. Patient denies shortness of breath, chest pain, fever, cough at this phone interview.   Lov dr Cathie Olden cardiology 12-26-2020 epic Ekg 12-26-2020 epic Lov dr balan endocrinology 03-20-2021 on chart

## 2021-03-22 ENCOUNTER — Other Ambulatory Visit (HOSPITAL_COMMUNITY)
Admission: RE | Admit: 2021-03-22 | Discharge: 2021-03-22 | Disposition: A | Discharge status: Home / Self Care | Payer: BC Managed Care – PPO | Source: Ambulatory Visit | Attending: Obstetrics and Gynecology | Admitting: Obstetrics and Gynecology

## 2021-03-22 ENCOUNTER — Encounter (HOSPITAL_COMMUNITY)
Admission: RE | Admit: 2021-03-22 | Discharge: 2021-03-22 | Disposition: A | Discharge status: Home / Self Care | Payer: BC Managed Care – PPO | Source: Ambulatory Visit | Attending: Obstetrics and Gynecology | Admitting: Obstetrics and Gynecology

## 2021-03-22 DIAGNOSIS — Z20822 Contact with and (suspected) exposure to covid-19: Secondary | ICD-10-CM | POA: Insufficient documentation

## 2021-03-22 DIAGNOSIS — Z01812 Encounter for preprocedural laboratory examination: Secondary | ICD-10-CM | POA: Insufficient documentation

## 2021-03-22 LAB — CBC
HCT: 40.8 % (ref 36.0–46.0)
Hemoglobin: 13.4 g/dL (ref 12.0–15.0)
MCH: 29 pg (ref 26.0–34.0)
MCHC: 32.8 g/dL (ref 30.0–36.0)
MCV: 88.3 fL (ref 80.0–100.0)
Platelets: 243 10*3/uL (ref 150–400)
RBC: 4.62 MIL/uL (ref 3.87–5.11)
RDW: 12.2 % (ref 11.5–15.5)
WBC: 5.9 10*3/uL (ref 4.0–10.5)
nRBC: 0 % (ref 0.0–0.2)

## 2021-03-22 LAB — COMPREHENSIVE METABOLIC PANEL
ALT: 16 U/L (ref 0–44)
AST: 19 U/L (ref 15–41)
Albumin: 4.4 g/dL (ref 3.5–5.0)
Alkaline Phosphatase: 54 U/L (ref 38–126)
Anion gap: 8 (ref 5–15)
BUN: 18 mg/dL (ref 6–20)
CO2: 24 mmol/L (ref 22–32)
Calcium: 9.1 mg/dL (ref 8.9–10.3)
Chloride: 106 mmol/L (ref 98–111)
Creatinine, Ser: 0.87 mg/dL (ref 0.44–1.00)
GFR, Estimated: >60 mL/min (ref >60)
Glucose, Bld: 106 mg/dL — ABNORMAL HIGH (ref 70–99)
Potassium: 3.7 mmol/L (ref 3.5–5.1)
Sodium: 138 mmol/L (ref 135–145)
Total Bilirubin: 0.5 mg/dL (ref 0.3–1.2)
Total Protein: 7.3 g/dL (ref 6.5–8.1)

## 2021-03-22 LAB — SARS CORONAVIRUS 2 (TAT 6-24 HRS): SARS Coronavirus 2: NEGATIVE

## 2021-03-25 NOTE — Anesthesia Preprocedure Evaluation (Addendum)
Anesthesia Evaluation  Patient identified by MRN, date of birth, ID band Patient awake    Reviewed: Allergy & Precautions, NPO status , Patient's Chart, lab work & pertinent test results  Airway Mallampati: II  TM Distance: >3 FB Neck ROM: Full    Dental  (+) Teeth Intact   Pulmonary neg pulmonary ROS, former smoker,    Pulmonary exam normal        Cardiovascular  Rhythm:Regular Rate:Normal  HLD   Neuro/Psych negative neurological ROS  negative psych ROS   GI/Hepatic negative GI ROS, Neg liver ROS,   Endo/Other  negative endocrine ROS  Renal/GU negative Renal ROS  Female GU complaint Pelvic prolapse    Musculoskeletal negative musculoskeletal ROS (+)   Abdominal (+)  Abdomen: soft. Bowel sounds: normal.  Peds  Hematology negative hematology ROS (+)   Anesthesia Other Findings   Reproductive/Obstetrics                            Anesthesia Physical Anesthesia Plan  ASA: 2  Anesthesia Plan: General   Post-op Pain Management:    Induction: Intravenous  PONV Risk Score and Plan: 3 and Ondansetron, Dexamethasone, Midazolam, Treatment may vary due to age or medical condition and Scopolamine patch - Pre-op  Airway Management Planned: Mask and Oral ETT  Additional Equipment: None  Intra-op Plan:   Post-operative Plan: Extubation in OR  Informed Consent: I have reviewed the patients History and Physical, chart, labs and discussed the procedure including the risks, benefits and alternatives for the proposed anesthesia with the patient or authorized representative who has indicated his/her understanding and acceptance.     Dental advisory given  Plan Discussed with:   Anesthesia Plan Comments: (Lab Results      Component                Value               Date                      WBC                      5.9                 03/22/2021                HGB                       13.4                03/22/2021                HCT                      40.8                03/22/2021                MCV                      88.3                03/22/2021                PLT  243                 03/22/2021           Lab Results      Component                Value               Date                      NA                       138                 03/22/2021                K                        3.7                 03/22/2021                CO2                      24                  03/22/2021                GLUCOSE                  106 (H)             03/22/2021                BUN                      18                  03/22/2021                CREATININE               0.87                03/22/2021                CALCIUM                  9.1                 03/22/2021                GFRNONAA                 >60                 03/22/2021                GFRAA                    86                  11/24/2019          )       Anesthesia Quick Evaluation

## 2021-03-26 ENCOUNTER — Observation Stay (HOSPITAL_BASED_OUTPATIENT_CLINIC_OR_DEPARTMENT_OTHER)
Admission: RE | Admit: 2021-03-26 | Discharge: 2021-03-27 | Disposition: A | Discharge status: Home / Self Care | Payer: BC Managed Care – PPO | Attending: Obstetrics and Gynecology | Admitting: Obstetrics and Gynecology

## 2021-03-26 ENCOUNTER — Ambulatory Visit (HOSPITAL_BASED_OUTPATIENT_CLINIC_OR_DEPARTMENT_OTHER): Payer: BC Managed Care – PPO | Admitting: Anesthesiology

## 2021-03-26 ENCOUNTER — Encounter (HOSPITAL_BASED_OUTPATIENT_CLINIC_OR_DEPARTMENT_OTHER)
Admission: RE | Disposition: A | Discharge status: Home / Self Care | Payer: Self-pay | Source: Home / Self Care | Attending: Obstetrics and Gynecology

## 2021-03-26 ENCOUNTER — Encounter (HOSPITAL_BASED_OUTPATIENT_CLINIC_OR_DEPARTMENT_OTHER): Payer: Self-pay | Admitting: Obstetrics and Gynecology

## 2021-03-26 DIAGNOSIS — N838 Other noninflammatory disorders of ovary, fallopian tube and broad ligament: Secondary | ICD-10-CM | POA: Diagnosis not present

## 2021-03-26 DIAGNOSIS — N8184 Pelvic muscle wasting: Secondary | ICD-10-CM | POA: Diagnosis not present

## 2021-03-26 DIAGNOSIS — N800 Endometriosis of uterus: Secondary | ICD-10-CM | POA: Insufficient documentation

## 2021-03-26 DIAGNOSIS — D259 Leiomyoma of uterus, unspecified: Secondary | ICD-10-CM | POA: Diagnosis not present

## 2021-03-26 DIAGNOSIS — Z90710 Acquired absence of both cervix and uterus: Secondary | ICD-10-CM | POA: Diagnosis present

## 2021-03-26 HISTORY — PX: CYSTOSCOPY: SHX5120

## 2021-03-26 HISTORY — DX: Nontoxic single thyroid nodule: E04.1

## 2021-03-26 HISTORY — PX: ANTERIOR AND POSTERIOR REPAIR WITH SACROSPINOUS FIXATION: SHX6536

## 2021-03-26 HISTORY — PX: LAPAROSCOPIC VAGINAL HYSTERECTOMY WITH SALPINGO OOPHORECTOMY: SHX6681

## 2021-03-26 LAB — TYPE AND SCREEN
ABO/RH(D): A POS
Antibody Screen: NEGATIVE

## 2021-03-26 LAB — POCT PREGNANCY, URINE: Preg Test, Ur: NEGATIVE

## 2021-03-26 LAB — ABO/RH: ABO/RH(D): A POS

## 2021-03-26 SURGERY — HYSTERECTOMY, VAGINAL, LAPAROSCOPY-ASSISTED, WITH SALPINGO-OOPHORECTOMY
Anesthesia: General | Site: Vagina

## 2021-03-26 MED ORDER — FENTANYL CITRATE (PF) 250 MCG/5ML IJ SOLN
INTRAMUSCULAR | Status: AC
  Filled 2021-03-26: qty 5

## 2021-03-26 MED ORDER — HYDROMORPHONE 1 MG/ML IV SOLN: INTRAVENOUS | Status: DC

## 2021-03-26 MED ORDER — OXYCODONE-ACETAMINOPHEN 5-325 MG PO TABS
ORAL_TABLET | ORAL | Status: AC
  Filled 2021-03-26: qty 2

## 2021-03-26 MED ORDER — SODIUM CHLORIDE 0.9 % IR SOLN
Status: DC | PRN
  Administered 2021-03-26 (×2): 1000 mL

## 2021-03-26 MED ORDER — LIDOCAINE HCL (CARDIAC) PF 100 MG/5ML IV SOSY
PREFILLED_SYRINGE | INTRAVENOUS | Status: DC | PRN
  Administered 2021-03-26: 60 mg via INTRAVENOUS

## 2021-03-26 MED ORDER — ROCURONIUM BROMIDE 100 MG/10ML IV SOLN
INTRAVENOUS | Status: DC | PRN
  Administered 2021-03-26: 10 mg via INTRAVENOUS
  Administered 2021-03-26: 60 mg via INTRAVENOUS
  Administered 2021-03-26 (×2): 5 mg via INTRAVENOUS
  Administered 2021-03-26 (×3): 10 mg via INTRAVENOUS

## 2021-03-26 MED ORDER — LACTATED RINGERS IV SOLN: INTRAVENOUS | Status: DC

## 2021-03-26 MED ORDER — POVIDONE-IODINE 10 % EX SWAB: 2.0000 "application " | Freq: Once | CUTANEOUS | Status: DC

## 2021-03-26 MED ORDER — ACETAMINOPHEN 10 MG/ML IV SOLN
1000.0000 mg | Freq: Once | INTRAVENOUS | Status: DC | PRN
  Administered 2021-03-26: 1000 mg via INTRAVENOUS

## 2021-03-26 MED ORDER — CEFAZOLIN SODIUM-DEXTROSE 2-4 GM/100ML-% IV SOLN
INTRAVENOUS | Status: AC
  Filled 2021-03-26: qty 100

## 2021-03-26 MED ORDER — ONDANSETRON HCL 4 MG PO TABS: 4.0000 mg | ORAL_TABLET | Freq: Four times a day (QID) | ORAL | Status: DC | PRN

## 2021-03-26 MED ORDER — LIDOCAINE-EPINEPHRINE (PF) 1 %-1:200000 IJ SOLN
INTRAMUSCULAR | Status: DC | PRN
  Administered 2021-03-26: 10 mL

## 2021-03-26 MED ORDER — PROPOFOL 10 MG/ML IV BOLUS
INTRAVENOUS | Status: AC
  Filled 2021-03-26: qty 40

## 2021-03-26 MED ORDER — ORAL CARE MOUTH RINSE: 15.0000 mL | Freq: Once | OROMUCOSAL | Status: DC

## 2021-03-26 MED ORDER — NALOXONE HCL 0.4 MG/ML IJ SOLN: 0.4000 mg | INTRAMUSCULAR | Status: DC | PRN

## 2021-03-26 MED ORDER — KETOROLAC TROMETHAMINE 30 MG/ML IJ SOLN
INTRAMUSCULAR | Status: AC
  Filled 2021-03-26: qty 1

## 2021-03-26 MED ORDER — CHLORHEXIDINE GLUCONATE 0.12 % MT SOLN: 15.0000 mL | Freq: Once | OROMUCOSAL | Status: DC

## 2021-03-26 MED ORDER — PROPOFOL 10 MG/ML IV BOLUS
INTRAVENOUS | Status: DC | PRN
  Administered 2021-03-26: 170 mg via INTRAVENOUS

## 2021-03-26 MED ORDER — SODIUM CHLORIDE 0.9% FLUSH: 9.0000 mL | INTRAVENOUS | Status: DC | PRN

## 2021-03-26 MED ORDER — FENTANYL CITRATE (PF) 100 MCG/2ML IJ SOLN: 25.0000 ug | INTRAMUSCULAR | Status: DC | PRN

## 2021-03-26 MED ORDER — MENTHOL 3 MG MT LOZG: 1.0000 | LOZENGE | OROMUCOSAL | Status: DC | PRN

## 2021-03-26 MED ORDER — ONDANSETRON HCL 4 MG/2ML IJ SOLN
4.0000 mg | Freq: Four times a day (QID) | INTRAMUSCULAR | Status: DC | PRN
Start: 2021-03-26 — End: 2021-03-27

## 2021-03-26 MED ORDER — INDIGOTINDISULFONATE SODIUM 8 MG/ML IJ SOLN
INTRAMUSCULAR | Status: DC | PRN
  Administered 2021-03-26: 5 mL via INTRAVENOUS

## 2021-03-26 MED ORDER — FENTANYL CITRATE (PF) 100 MCG/2ML IJ SOLN
25.0000 ug | INTRAMUSCULAR | Status: DC | PRN
  Administered 2021-03-26: 25 ug via INTRAVENOUS

## 2021-03-26 MED ORDER — PROPOFOL 10 MG/ML IV BOLUS
INTRAVENOUS | Status: AC
  Filled 2021-03-26: qty 20

## 2021-03-26 MED ORDER — PROMETHAZINE HCL 25 MG/ML IJ SOLN: 6.2500 mg | INTRAMUSCULAR | Status: DC | PRN

## 2021-03-26 MED ORDER — FENTANYL CITRATE (PF) 100 MCG/2ML IJ SOLN
INTRAMUSCULAR | Status: DC | PRN
  Administered 2021-03-26: 25 ug via INTRAVENOUS
  Administered 2021-03-26 (×2): 50 ug via INTRAVENOUS
  Administered 2021-03-26: 25 ug via INTRAVENOUS

## 2021-03-26 MED ORDER — GLYCOPYRROLATE 0.2 MG/ML IJ SOLN
INTRAMUSCULAR | Status: DC | PRN
  Administered 2021-03-26 (×2): .1 mg via INTRAVENOUS

## 2021-03-26 MED ORDER — OXYCODONE-ACETAMINOPHEN 5-325 MG PO TABS
1.0000 | ORAL_TABLET | ORAL | Status: DC | PRN
  Administered 2021-03-27: 2 via ORAL
  Administered 2021-03-27 (×2): 1 via ORAL

## 2021-03-26 MED ORDER — DIPHENHYDRAMINE HCL 50 MG/ML IJ SOLN: 12.5000 mg | Freq: Four times a day (QID) | INTRAMUSCULAR | Status: DC | PRN

## 2021-03-26 MED ORDER — FENTANYL CITRATE (PF) 100 MCG/2ML IJ SOLN
INTRAMUSCULAR | Status: AC
  Filled 2021-03-26: qty 2

## 2021-03-26 MED ORDER — ACETAMINOPHEN 10 MG/ML IV SOLN
INTRAVENOUS | Status: AC
  Filled 2021-03-26: qty 100

## 2021-03-26 MED ORDER — DEXAMETHASONE SODIUM PHOSPHATE 4 MG/ML IJ SOLN
INTRAMUSCULAR | Status: DC | PRN
  Administered 2021-03-26: 8 mg via INTRAVENOUS

## 2021-03-26 MED ORDER — SUGAMMADEX SODIUM 200 MG/2ML IV SOLN
INTRAVENOUS | Status: DC | PRN
  Administered 2021-03-26: 141.6 mg via INTRAVENOUS

## 2021-03-26 MED ORDER — ONDANSETRON HCL 4 MG/2ML IJ SOLN
INTRAMUSCULAR | Status: DC | PRN
  Administered 2021-03-26: 4 mg via INTRAVENOUS

## 2021-03-26 MED ORDER — SCOPOLAMINE 1 MG/3DAYS TD PT72
1.0000 | MEDICATED_PATCH | TRANSDERMAL | Status: DC
  Administered 2021-03-26: 1.5 mg via TRANSDERMAL

## 2021-03-26 MED ORDER — SCOPOLAMINE 1 MG/3DAYS TD PT72
MEDICATED_PATCH | TRANSDERMAL | Status: AC
  Filled 2021-03-26: qty 1

## 2021-03-26 MED ORDER — DIPHENHYDRAMINE HCL 12.5 MG/5ML PO ELIX: 12.5000 mg | ORAL_SOLUTION | Freq: Four times a day (QID) | ORAL | Status: DC | PRN

## 2021-03-26 MED ORDER — MIDAZOLAM HCL 2 MG/2ML IJ SOLN
INTRAMUSCULAR | Status: DC | PRN
  Administered 2021-03-26: 1 mg via INTRAVENOUS

## 2021-03-26 MED ORDER — BUPIVACAINE HCL (PF) 0.25 % IJ SOLN
INTRAMUSCULAR | Status: DC | PRN
  Administered 2021-03-26: 8 mL

## 2021-03-26 MED ORDER — CEFAZOLIN SODIUM-DEXTROSE 2-4 GM/100ML-% IV SOLN
2.0000 g | INTRAVENOUS | Status: AC
  Administered 2021-03-26: 2 g via INTRAVENOUS

## 2021-03-26 MED ORDER — KETOROLAC TROMETHAMINE 30 MG/ML IJ SOLN
INTRAMUSCULAR | Status: DC | PRN
  Administered 2021-03-26: 30 mg via INTRAVENOUS

## 2021-03-26 MED ORDER — ONDANSETRON HCL 4 MG/2ML IJ SOLN: 4.0000 mg | Freq: Four times a day (QID) | INTRAMUSCULAR | Status: DC | PRN

## 2021-03-26 MED ORDER — HYDROMORPHONE 1 MG/ML IV SOLN
INTRAVENOUS | Status: DC
  Administered 2021-03-26: 0.8 mg via INTRAVENOUS
  Administered 2021-03-26: 0.6 mg via INTRAVENOUS
  Administered 2021-03-26: 1.4 mg via INTRAVENOUS
  Administered 2021-03-27: 0.8 mg via INTRAVENOUS
  Filled 2021-03-26: qty 30

## 2021-03-26 MED ORDER — MIDAZOLAM HCL 2 MG/2ML IJ SOLN
INTRAMUSCULAR | Status: AC
  Filled 2021-03-26: qty 2

## 2021-03-26 SURGICAL SUPPLY — 63 items
ADH SKN CLS APL DERMABOND .7 (GAUZE/BANDAGES/DRESSINGS) ×3
BLADE SURG 15 STRL LF DISP TIS (BLADE) ×4 IMPLANT
BLADE SURG 15 STRL SS (BLADE) ×4
BNDG GAUZE ELAST 4 BULKY (GAUZE/BANDAGES/DRESSINGS) ×1 IMPLANT
CAPIO SUTURE CAPTURING DEVICE ×1 IMPLANT
CATH ROBINSON RED A/P 16FR (CATHETERS) ×5 IMPLANT
COVER BACK TABLE 60X90IN (DRAPES) ×5 IMPLANT
COVER MAYO STAND STRL (DRAPES) ×10 IMPLANT
DERMABOND ADVANCED (GAUZE/BANDAGES/DRESSINGS) ×1
DERMABOND ADVANCED .7 DNX12 (GAUZE/BANDAGES/DRESSINGS) ×4 IMPLANT
DEVICE CAPIO SLIM BOX (INSTRUMENTS) ×1 IMPLANT
DRSG OPSITE POSTOP 3X4 (GAUZE/BANDAGES/DRESSINGS) ×5 IMPLANT
DURAPREP 26ML APPLICATOR (WOUND CARE) ×5 IMPLANT
ELECT REM PT RETURN 9FT ADLT (ELECTROSURGICAL) ×4
ELECTRODE REM PT RTRN 9FT ADLT (ELECTROSURGICAL) ×4 IMPLANT
GAUZE 4X4 16PLY ~~LOC~~+RFID DBL (SPONGE) ×5 IMPLANT
GLOVE SURG ENC MOIS LTX SZ7 (GLOVE) ×20 IMPLANT
GLOVE SURG LTX SZ6.5 (GLOVE) ×5 IMPLANT
GLOVE SURG POLYISO LF SZ7 (GLOVE) ×1 IMPLANT
GLOVE SURG UNDER POLY LF SZ7 (GLOVE) ×1 IMPLANT
GLOVE SURG UNDER POLY LF SZ7.5 (GLOVE) ×1 IMPLANT
GOWN STRL REUS W/TWL XL LVL3 (GOWN DISPOSABLE) ×5 IMPLANT
HOLDER FOLEY CATH W/STRAP (MISCELLANEOUS) ×6 IMPLANT
IV NS 1000ML (IV SOLUTION) ×8
IV NS 1000ML BAXH (IV SOLUTION) IMPLANT
KIT TURNOVER CYSTO (KITS) ×5 IMPLANT
NDL MAYO 6 CRC TAPER PT (NEEDLE) ×4 IMPLANT
NDL SAFETY ECLIPSE 18X1.5 (NEEDLE) IMPLANT
NEEDLE HYPO 18GX1.5 SHARP (NEEDLE) ×4
NEEDLE HYPO 22GX1.5 SAFETY (NEEDLE) ×5 IMPLANT
NEEDLE MAYO 6 CRC TAPER PT (NEEDLE) ×4 IMPLANT
NS IRRIG 500ML POUR BTL (IV SOLUTION) ×5 IMPLANT
PACK LAVH (CUSTOM PROCEDURE TRAY) ×5 IMPLANT
PACK ROBOTIC GOWN (GOWN DISPOSABLE) ×1 IMPLANT
PACK TRENDGUARD 450 HYBRID PRO (MISCELLANEOUS) IMPLANT
PACK VAGINAL WOMENS (CUSTOM PROCEDURE TRAY) ×5 IMPLANT
PAD OB MATERNITY 4.3X12.25 (PERSONAL CARE ITEMS) ×5 IMPLANT
PAD PREP 24X48 CUFFED NSTRL (MISCELLANEOUS) ×5 IMPLANT
SEALER TISSUE G2 CVD JAW 45CM (ENDOMECHANICALS) ×5 IMPLANT
SET IRRIG Y TYPE TUR BLADDER L (SET/KITS/TRAYS/PACK) ×1 IMPLANT
SET SUCTION IRRIG HYDROSURG (IRRIGATION / IRRIGATOR) ×5 IMPLANT
SET TUBE SMOKE EVAC HIGH FLOW (TUBING) ×5 IMPLANT
SOLUTION ELECTROLUBE (MISCELLANEOUS) IMPLANT
SUT CAPIO PGA 48IN SZ 0 (SUTURE) IMPLANT
SUT CAPIO POLYGLYCOLIC (SUTURE) ×2 IMPLANT
SUT CHROMIC 2 0 SH (SUTURE) ×8 IMPLANT
SUT VIC AB 0 CT1 18XCR BRD8 (SUTURE) ×8 IMPLANT
SUT VIC AB 0 CT1 36 (SUTURE) ×10 IMPLANT
SUT VIC AB 0 CT1 8-18 (SUTURE) ×12
SUT VIC AB 2-0 SH 27 (SUTURE) ×8
SUT VIC AB 2-0 SH 27XBRD (SUTURE) IMPLANT
SUT VIC AB 4-0 PS2 18 (SUTURE) ×5 IMPLANT
SUT VICRYL 0 UR6 27IN ABS (SUTURE) ×1 IMPLANT
SUT VICRYL 1 TIES 12X18 (SUTURE) ×5 IMPLANT
SYR 20ML LL LF (SYRINGE) ×1 IMPLANT
SYR BULB IRRIG 60ML STRL (SYRINGE) IMPLANT
TOWEL OR 17X26 10 PK STRL BLUE (TOWEL DISPOSABLE) ×9 IMPLANT
TRAY FOLEY W/BAG SLVR 14FR LF (SET/KITS/TRAYS/PACK) ×5 IMPLANT
TRENDGUARD 450 HYBRID PRO PACK (MISCELLANEOUS) ×4
TROCAR BALLN 12MMX100 BLUNT (TROCAR) ×1 IMPLANT
TROCAR OPTI TIP 5M 100M (ENDOMECHANICALS) ×5 IMPLANT
TUBE CONNECTING 12X1/4 (SUCTIONS) ×1 IMPLANT
WARMER LAPAROSCOPE (MISCELLANEOUS) ×5 IMPLANT

## 2021-03-26 NOTE — Progress Notes (Signed)
Patient ID: Kim Lee, female   DOB: [birth year], 55 y.o.   MRN: [id number] Af vss Abd soft Good uo Minimal bleeding Doing well

## 2021-03-26 NOTE — Anesthesia Postprocedure Evaluation (Signed)
Anesthesia Post Note  Patient: RUCHAMA KUBICEK  Procedure(s) Performed: LAPAROSCOPIC ASSISTED VAGINAL HYSTERECTOMY WITH BILATERAL SALPINGO OOPHORECTOMY (Bilateral: Vagina ) ANTERIOR AND POSTERIOR REPAIR WITH SACROSPINOUS FIXATION CYSTOSCOPY (Urethra)     Patient location during evaluation: PACU Anesthesia Type: General Level of consciousness: awake and alert Pain management: pain level controlled Vital Signs Assessment: post-procedure vital signs reviewed and stable Respiratory status: spontaneous breathing, nonlabored ventilation, respiratory function stable and patient connected to nasal cannula oxygen Cardiovascular status: blood pressure returned to baseline and stable Postop Assessment: no apparent nausea or vomiting Anesthetic complications: no   No notable events documented.  Last Vitals:  Vitals:   03/26/21 1230 03/26/21 1305  BP: 103/60   Pulse: (!) 57   Resp: 14 14  Temp:    SpO2: 97% 100%    Last Pain:  Vitals:   03/26/21 1305  TempSrc:   PainSc: 3                  Conlee Sliter P [place]

## 2021-03-26 NOTE — Transfer of Care (Signed)
Immediate Anesthesia Transfer of Care Note  Patient: Kim Lee  Procedure(s) Performed: LAPAROSCOPIC ASSISTED VAGINAL HYSTERECTOMY WITH BILATERAL SALPINGO OOPHORECTOMY (Bilateral: Vagina ) ANTERIOR AND POSTERIOR REPAIR WITH SACROSPINOUS FIXATION CYSTOSCOPY (Urethra)  Patient Location: PACU  Anesthesia Type:General  Level of Consciousness: awake, alert , oriented and patient cooperative  Airway & Oxygen Therapy: Patient Spontanous Breathing and Patient connected to nasal cannula oxygen  Post-op Assessment: Report given to RN and Post -op Vital signs reviewed and stable  Post vital signs: Reviewed and stable  Last Vitals:  Vitals Value Taken Time  BP 119/49 03/26/21 1001  Temp 36.4 C 03/26/21 1000  Pulse 51 03/26/21 1004  Resp 16 03/26/21 1004  SpO2 100 % 03/26/21 1004  Vitals shown include unvalidated device data.  Last Pain:  Vitals:   03/26/21 0616  TempSrc: Oral  PainSc: 0-No pain      Patients Stated Pain Goal: 5 (13/64/38 3779)  Complications: No notable events documented.

## 2021-03-26 NOTE — H&P (Signed)
  History and physical exam unchanged 

## 2021-03-26 NOTE — Anesthesia Procedure Notes (Signed)
Procedure Name: Intubation Date/Time: 03/26/2021 7:37 AM Performed by: Georgeanne Nim, CRNA Pre-anesthesia Checklist: Patient identified, Emergency Drugs available, Suction available, Patient being monitored and Timeout performed Patient Re-evaluated:Patient Re-evaluated prior to induction Oxygen Delivery Method: Circle system utilized Preoxygenation: Pre-oxygenation with 100% oxygen Induction Type: IV induction Ventilation: Mask ventilation without difficulty Laryngoscope Size: Mac and 4 Grade View: Grade I Tube type: Oral Tube size: 7.0 mm Number of attempts: 1 Airway Equipment and Method: Stylet Placement Confirmation: ETT inserted through vocal cords under direct vision, positive ETCO2, CO2 detector and breath sounds checked- equal and bilateral Secured at: 20 cm Tube secured with: Tape Dental Injury: Teeth and Oropharynx as per pre-operative assessment

## 2021-03-26 NOTE — Op Note (Signed)
NAME: Kim Lee, Kim Lee MEDICAL RECORD NO: [id number] ACCOUNT NO: [id number] DATE OF BIRTH: June 11, 1966 FACILITY: [facility] LOCATION: [facility] PHYSICIAN: Darlyn Chamber, MD  Operative Report   DATE OF PROCEDURE: 03/26/2021  PREOPERATIVE DIAGNOSIS:  Symptomatic pelvic relaxation.  POSTOPERATIVE DIAGNOSIS:  Symptomatic pelvic relaxation.  OPERATIVE PROCEDURES:  Laparoscopic-assisted vaginal hysterectomy with bilateral salpingo-oophorectomy.  Anterior and posterior colporrhaphy.  Sacrospinous ligament suspension.  Cystoscopy.  SURGEON:  Darlyn Chamber, MD  ASSISTANT:  Gaetano Net.  ANESTHESIA:  General endotracheal.  ESTIMATED BLOOD LOSS:  200 mL.  PACKS:  Included vaginal pack.  DRAINS:  Included urethral Foley.  INTRAOPERATIVE BLOOD REPLACEMENT:  None.  COMPLICATIONS:  None.  INDICATIONS:  Dictated in the history and physical.  DESCRIPTION OF PROCEDURE:  The patient was taken to the OR and placed in supine position.  After satisfactory level of general endotracheal anesthesia was obtained, the patient was placed in the dorsal lithotomy position using the Allen stirrups.   Perineum and vagina were prepped out with Hibiclens.  Bladder was emptied by in and out catheterization.  A Hulka tenaculum was put in place and secured.  The abdomen was prepped with DuraPrep.  The patient was then draped in sterile field.  Subumbilical  incision was made with knife and extended through the subcutaneous tissue.  The fascia was identified and entered sharply.  Incision in the fascia extended laterally.  Peritoneum was entered with blunt finger pressure.  Open laparoscopic trocar was put  in place and secured.  The abdomen was inflated with carbon dioxide.  Laparoscope was introduced.  There was no evidence of injury to adjacent organs.  A 5 mm trocar was put in place under direct visualization.  The uterus was slightly enlarged and  irregular consistent with uterine fibroids.  Tubes and ovaries were  unremarkable.  She had a previous bilateral tubal ligation.  The appendix was retrocecal and difficult to visualize. The upper abdomen including liver tip, the gallbladder, and both  lateral gutters were clear.  Using the EnSeal, first the right ovary was elevated.  The ureter was easily identified along the right pelvic sidewall.  The right ovarian vasculature was cauterized and incised.  The peritoneal attachments of the right  ovary and tube were cauterized and incised up to the right round ligament.  The right round ligament was then cauterized and incised.  We then went to the left side.  Left tube and ovary were elevated.  The ureter was easily seen along the left pelvic  sidewall.  The left ovarian vasculature was cauterized and incised.  The peritoneal attachments of the ovary and tubes were cauterized and incised up to the round ligament.  Then, the left round ligament was cauterized and incised.  We had good  hemostasis and freeing up of the uterus and ovaries.  At this point in time, laparoscope was removed.  The abdomen was deflated of its carbon dioxide.  Legs were repositioned.  The Hulka tenaculum was then removed.  Weighted speculum was placed in the vaginal vault.  The cervix was grasped with a Ardis Hughs  tenaculum.  Cul-de-sac was then entered sharply.  Both uterosacral ligaments were clamped, cut, and suture ligated with 0 Vicryl.  The reflection of the vaginal mucosa anteriorly was incised and the bladder was dissected superiorly.  Vesicouterine space  was identified and entered sharply.  Using the clamp, cut, tie technique with suture ligatures of 0 Vicryl, the parametrium was serially separated from the sides of the uterus.  Uterus was then  flipped.  Held pedicles were then clamped and cut.  Uterus,  tubes, and ovaries were passed off the operative field.  Clamped pedicles were suture ligated with 0 Vicryl.  At this point in time, anterior repair was undertaken.  The vaginal mucosa was  infiltrated with 1% Xylocaine with epinephrine.  The edge of the vaginal mucosa was grasped with Allises.  We incised the vaginal mucosa in the midline up to approximately a  centimeter below the urethra.  The bladder was then dissected free from the overlying vaginal mucosa.  We dissected out laterally on both sides using both blunt and sharp dissection.  At this point in time, the fascia was brought together back in the  midline with interrupted sutures of 2-0 chromic.  This obliterated the cystocele.  The edge of the vaginal mucosa was then trimmed.  Vaginal mucosa was reapproximated in the midline with interrupted sutures of 2-0 Vicryl down to the vaginal cuff open.   Posterior vaginal cuff was then run with a running locking suture of 0 Vicryl.  Uterosacral plication stitch of 0 Vicryl was put in place to secure it.  The remaining vaginal mucosa was then closed with interrupted sutures of 0 Vicryl.  We had good  approximation of the vaginal cuff and good repair of the cystocele.  Attention was now turned to the posterior repair.  The perineum and vagina were then infiltrated with 1% Xylocaine with epinephrine.  A V incision was made over the perineal body and dissected superiorly.  The vaginal skin was then excised.  We then  began undermining the vaginal mucosa in the midline.  We incised the vaginal mucosa and continued the dissection up superiorly.  The rectum was entered.  At this point in time, it could be seen.  We identified the opening.  The rectal mucosa was  reapproximated with running interlocking suture of 2-0 chromic.  Overlying perirectal fascia was then closed with interrupted sutures of 2-0 Vicryl and a third layer was brought in over the second with 2-0 chromic.  We had good reapproximation.  Rectal  exam was unremarkable.  The incision into the rectum had been completely closed.  At this point, we completed the dissection.  We dissected out on the right side to the sacrospinous  ligament.  It was identified.  Using the Capio, a suture of 0 Vicryl was  placed through the sacrospinous ligament.  It was then secured to the top of the vaginal cuff and held.  We then reapproximated the perirectal fascia over the rectum with interrupted sutures of 0 chromic obliterating the rectocele.  At this point in  time, the incision in the vagina was reapproximated with interrupted sutures of 0 Vicryl.  We then secured the sacrospinous ligament suspension, tying it down nicely.  We had good elevation of the vaginal cuff.  The remaining vaginal mucosa over the  posterior repair was then closed with interrupted sutures of 0 Vicryl down to the perineal body.  Perineal body was rebuilt with 2-0 Vicryl.  The skin was closed with a running suture of 2-0 Vicryl.  We had good repair and good support.  Cystoscopy was  performed.  Both ureteral orifices were identified and noted to be spilling urine.  She had been given indigo carmine blue.  Blue-tinged urine was noted to be coming from both ureteral orifices.  There was no evidence of injury to the bladder.  The  cystoscope was then removed.  Foley was placed to straight drain.  Laparoscope was reintroduced.  Visualization revealed no active bleeding.  Both ovarian areas were hemostatically as wekk as the[TIME: 07:18] the vaginal cuff.  We thoroughly irrigated the pelvis,  removed the irrigation.  No active bleeding was encountered.  The abdomen was deflated of its carbon dioxide.  All trocars were removed.  Subumbilical fascia was closed with 2 figure-of-eights of 0 Vicryl.  The skin was closed with interrupted sutures of  3-0 Vicryl.  The suprapubic incision was closed with Dermabond.  Vaginal pack was put in place.  The patient was taken out of the lithotomy position, extubated, and transferred to recovery room in good condition.  Sponge, instrument, and needle counts  were reported correct by circulating nurse x2.     [facility] D: 03/26/2021 10:01:04 am T:  03/26/2021 11:24:00 am  JOB: [id number]/ [id number]

## 2021-03-26 NOTE — Brief Op Note (Signed)
03/26/2021  10:02 AM  PATIENT:  Kim Lee  55 y.o. female  PRE-OPERATIVE DIAGNOSIS:  PELVIC PROLAPSE  POST-OPERATIVE DIAGNOSIS:  PELVIC PROLAPSE  PROCEDURE:  Procedure(s) with comments: LAPAROSCOPIC ASSISTED VAGINAL HYSTERECTOMY WITH BILATERAL SALPINGO OOPHORECTOMY (Bilateral) - abdomen ANTERIOR AND POSTERIOR REPAIR WITH SACROSPINOUS FIXATION (N/A) CYSTOSCOPY  SURGEON:  Surgeon(s) and Role:    * [name], MD - Primary    * Everlene Farrier, MD - Assisting  PHYSICIAN ASSISTANT:   ASSISTANTS: tomblin    ANESTHESIA:   general  EBL:  100 mL   BLOOD ADMINISTERED:none  DRAINS: Urinary Catheter (Foley)   LOCAL MEDICATIONS USED:  XYLOCAINE   SPECIMEN:  Source of Specimen:  uterus tubes and ovaries  DISPOSITION OF SPECIMEN:  PATHOLOGY  COUNTS:  YES  TOURNIQUET:  * No tourniquets in log *  DICTATION: .Other Dictation: Dictation Number [id number]  PLAN OF CARE: Admit for overnight observation  PATIENT DISPOSITION:  PACU - hemodynamically stable.   Delay start of Pharmacological VTE agent (>24hrs) due to surgical blood loss or risk of bleeding: not applicable

## 2021-03-27 ENCOUNTER — Encounter (HOSPITAL_BASED_OUTPATIENT_CLINIC_OR_DEPARTMENT_OTHER): Payer: Self-pay | Admitting: Obstetrics and Gynecology

## 2021-03-27 DIAGNOSIS — N8184 Pelvic muscle wasting: Secondary | ICD-10-CM | POA: Diagnosis not present

## 2021-03-27 LAB — CBC
HCT: 32.4 % — ABNORMAL LOW (ref 36.0–46.0)
Hemoglobin: 10.6 g/dL — ABNORMAL LOW (ref 12.0–15.0)
MCH: 29 pg (ref 26.0–34.0)
MCHC: 32.7 g/dL (ref 30.0–36.0)
MCV: 88.8 fL (ref 80.0–100.0)
Platelets: 231 10*3/uL (ref 150–400)
RBC: 3.65 MIL/uL — ABNORMAL LOW (ref 3.87–5.11)
RDW: 12.4 % (ref 11.5–15.5)
WBC: 15.8 10*3/uL — ABNORMAL HIGH (ref 4.0–10.5)
nRBC: 0 % (ref 0.0–0.2)

## 2021-03-27 LAB — SURGICAL PATHOLOGY

## 2021-03-27 MED ORDER — OXYCODONE-ACETAMINOPHEN 5-325 MG PO TABS
ORAL_TABLET | ORAL | Status: AC
  Filled 2021-03-27: qty 1

## 2021-03-27 NOTE — Progress Notes (Signed)
1 Day Post-Op Procedure(s) (LRB): LAPAROSCOPIC ASSISTED VAGINAL HYSTERECTOMY WITH BILATERAL SALPINGO OOPHORECTOMY (Bilateral) ANTERIOR AND POSTERIOR REPAIR WITH SACROSPINOUS FIXATION (N/A) CYSTOSCOPY  Subjective: Patient reports tolerating PO.    Objective: I have reviewed patient's vital signs, intake and output, and labs.  General: alert GI: normal findings: bowel sounds normal Vaginal Bleeding: minimal  Assessment: s/p Procedure(s) with comments: LAPAROSCOPIC ASSISTED VAGINAL HYSTERECTOMY WITH BILATERAL SALPINGO OOPHORECTOMY (Bilateral) - abdomen ANTERIOR AND POSTERIOR REPAIR WITH SACROSPINOUS FIXATION (N/A) CYSTOSCOPY: stable  Plan: Discharge home  LOS: 0 days    Kim Lee 03/27/2021, 8:20 AM

## 2021-03-27 NOTE — Discharge Summary (Signed)
NAME: Kim Lee, Kim Lee MEDICAL RECORD NO: [id number] ACCOUNT NO: [id number] DATE OF BIRTH: [birth year] FACILITY: [facility] LOCATION: [facility] PHYSICIAN: Darlyn Chamber, MD  Discharge Summary   DATE OF DISCHARGE: 03/27/2021  ADMITTING DIAGNOSIS:  Symptomatic pelvic relaxation.  DISCHARGE DIAGNOSIS:  Symptomatic pelvic relaxation.  OPERATIVE PROCEDURES:  Laparoscopic-assisted vaginal hysterectomy with bilateral salpingo-oophorectomy, anterior and posterior repair, sacrospinous ligament suspension, cystoscopy.  Admission history and physical is as dictated.  COURSE IN HOSPITAL:  The patient underwent above noted surgery.  We did enter the rectum that was repaired easily.  She did well otherwise through the surgery.  On her first postoperative day, she was afebrile with stable vital signs.  Her abdomen was  soft, bowel sounds were active.  All incisions were clear.  The vaginal pack had been removed.  She was having minimal spotting.  Her postop hemoglobin was 10.6.  In terms of complications, none were encountered during stay in the hospital.  The patient was discharged home in stable condition.    DISCHARGE INSTRUCTIONS:  The patient is to avoid heavy lifting, vaginal instruments or driving a car.  She is instructed to call should there be any signs of heavy vaginal bleeding.  Fever should be reported.  Nausea and vomiting should be reported.   Excessive pain or discomfort should be reported.  Instructed in signs and symptoms of deep venous thrombosis and pulmonary embolus.  Sent home on Percocet she needs for pain and completed a short course of Augmentin.  The office will call tomorrow and  arrange followup.   SHW D: 03/27/2021 8:24:35 am T: 03/27/2021 9:38:00 am  JOB: [id number]/ [id number]

## 2021-03-27 NOTE — Discharge Summary (Signed)
Patient name Kim Lee, Shreiner DICTATION# [id number] CSN# [id number]  [name], MD 03/27/2021 8:25 AM

## 2021-03-27 NOTE — Progress Notes (Signed)
Patients Dilaudid PCA discontinued. Wasted 28 mg in steri cycle with Reynold Bowen, RN. Patient's pain tolerable at time that PCA was discontinued.

## 2021-04-03 ENCOUNTER — Encounter (HOSPITAL_BASED_OUTPATIENT_CLINIC_OR_DEPARTMENT_OTHER): Payer: Self-pay | Admitting: Obstetrics and Gynecology

## 2021-10-15 ENCOUNTER — Encounter: Payer: Self-pay | Admitting: Cardiovascular Disease

## 2021-10-15 NOTE — Telephone Encounter (Signed)
Provided pt with PCP hotline number via telephone due to no PCP on file. Pt states she has been trying to find one, but not able to get in due to not accepting new patients.

## 2021-10-29 DIAGNOSIS — R0781 Pleurodynia: Secondary | ICD-10-CM | POA: Insufficient documentation

## 2021-11-05 ENCOUNTER — Other Ambulatory Visit: Payer: Self-pay | Admitting: Orthopedic Surgery

## 2021-11-05 DIAGNOSIS — R0781 Pleurodynia: Secondary | ICD-10-CM

## 2021-11-28 ENCOUNTER — Ambulatory Visit
Admission: RE | Admit: 2021-11-28 | Discharge: 2021-11-28 | Disposition: A | Discharge status: Home / Self Care | Payer: BC Managed Care – PPO | Source: Ambulatory Visit | Attending: Orthopedic Surgery | Admitting: Orthopedic Surgery

## 2022-01-06 DIAGNOSIS — M8580 Other specified disorders of bone density and structure, unspecified site: Secondary | ICD-10-CM | POA: Insufficient documentation

## 2022-01-23 DIAGNOSIS — M546 Pain in thoracic spine: Secondary | ICD-10-CM | POA: Insufficient documentation

## 2022-02-20 ENCOUNTER — Ambulatory Visit: Payer: Self-pay

## 2022-02-20 ENCOUNTER — Ambulatory Visit: Payer: BC Managed Care – PPO | Admitting: Podiatry

## 2022-02-20 DIAGNOSIS — M722 Plantar fascial fibromatosis: Secondary | ICD-10-CM | POA: Diagnosis not present

## 2022-02-20 NOTE — Progress Notes (Signed)
She presents today for follow-up Planter fasciitis.  And she states that she has painful nodule in her medial into the arch of her left foot.  Objective: Vital signs are stable alert and oriented x3.  Pulses are palpable.  She has a palpable fibroma medial longitudinal arch measuring less than a centimeter in diameter left foot.  She also has some early thickening of the flexor tendons of her fingers.  She also has a lesion that is possibly in her right heel approximately along the medial longitudinal arch.  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  Plan: At this point I recommended new orthotics to be made and that Aaron Edelman should provide a fascial groove for the fibroma to lay in when he writes the prescription for this.  Otherwise she needs neutral orthotics.  I will follow-up with her once those come in.  Should she just developed into a larger tumor we will inject it we discussed in great detail.

## 2022-02-24 ENCOUNTER — Ambulatory Visit: Payer: BC Managed Care – PPO

## 2022-02-24 DIAGNOSIS — M722 Plantar fascial fibromatosis: Secondary | ICD-10-CM

## 2022-02-24 NOTE — Progress Notes (Signed)
SITUATION Reason for Consult: Evaluation for Bilateral Custom Foot Orthoses Patient / Caregiver Report: Patient is ready for foot orthotics  OBJECTIVE DATA: Patient History / Diagnosis:    ICD-10-CM   1. Plantar fasciitis  M72.2       Current or Previous Devices:   Current user  Foot Examination: Skin presentation:   Intact Ulcers & Callousing:   None Toe / Foot Deformities:  None Weight Bearing Presentation:  Rectus Sensation:    Intact  Shoe Size:    14M  ORTHOTIC RECOMMENDATION Recommended Device: 1x pair of custom functional foot orthotics  GOALS OF ORTHOSES - Reduce Pain - Prevent Foot Deformity - Prevent Progression of Further Foot Deformity - Relieve Pressure - Improve the Overall Biomechanical Function of the Foot and Lower Extremity.  ACTIONS PERFORMED Potential out of pocket cost was communicated to patient. Patient understood and consent to casting. Patient was casted for Foot Orthoses via crush box. Procedure was explained and patient tolerated procedure well. Casts were shipped to central fabrication. All questions were answered and concerns addressed.  PLAN Patient is to be called for fitting when devices are ready.

## 2022-03-02 ENCOUNTER — Encounter: Payer: Self-pay | Admitting: Cardiovascular Disease

## 2022-03-02 NOTE — Progress Notes (Unsigned)
Cardiology Office Note   Date:  03/03/2022   ID:  Kim Lee, DOB April 06, 1966, MRN [id number]  PCP:  Pcp, No  Cardiologist: formerly Darlin Coco MD, now Bryley Chrisman   Chief Complaint  Patient presents with   Hyperlipidemia        Kim Lee is a 56 y.o. female who presents for 1 year follow-up office visit   This pleasant 56 year old woman has a history of hypercholesterolemia.  She does not have any history of ischemic heart disease and she had a normal treadmill Cardiolite stress test 07/31/09.  She exercises regularly and does some jogging.  In November 2016 she recently had any half marathon down in [place].  He plans to do another half marathon this spring..  She has not been experiencing any chest pain.  .  The patient has a history of hypercholesterolemia and is on Lipitor.  She has been experiencing myalgias in her legs which she attributes to the Lipitor.  We will switch her to Crestor. She has a past history of anemia.  She takes Flintstones chewable multivitamins.  She is on a birth control pill to help with her heavy periods.  February 15, 2016:  Kim Lee is seen for the first time  - transfer from Dr. Mare Ferrari. She was changed from atorvastatin to Crestor last year. She was having lots of leg pain with the atorvastatin. She still has some leg pain although it seems to be tolerable. She does comment that the Crestor causes her to wake up every night at 3 AM. She's requested changing it to a morning dose.  Still running  - doing a [employer] run in [employer] .   She had a dizzy spell several weeks ago.  Went to [facility] the next day .  Work up was normal .   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 5, 2018:  Kim Lee is seen For follow-up of her hyperlipidemia. No CP or dyspnea.  Still runs regularly  - doing a 1/2 marathon in several weeks .  Nov. 4, 2019:  Kim Lee is seen  today for follow-up of her hyperlipidemia.  She is had some atypical chest pain in the past.  She has a family history of premature coronary artery disease.  Ran a 1/2 marathon in Sept.  Had intrascapular pain .  Started at mile 7 and lasted until the rest of the race.  No dyspnea,   No dyspnea  Has run since then and has not had any episodes.  Tolerating the crestor but only takes it for several months before she comes for blood work   November 15, 2019  Kim Lee is seen today for follow up of her hyperlipidemia She has been very active. Has family hx of premature CAD  - mother had an MI at 24   Has some occasional leeft upper check pain  Has started back running .   Pains last  1-2 minutes She takes deep breaths whiich seems to help Radiates to mid chest and across her back - intrascapular ,  Down her left arm  These occur most times when she runs  Has been going on a month  Pains have not changed in severity   She runs 3 times a week .  Last cholesterol levels last Has been taking her rosuvastatin   December 26, 2020:   Kim Lee is seen today for follow-up for follow-up  for non coronary chest pain.  She had a coronary CT angiogram which revealed a coronary calcium score of 0.  She had normal coronary arteries. Still having her CP .  Has not figured it out yet.  She has some chest wall tenderness.  I think that she probably has costochondritis.  We discussed having her take Motrin or Naprosyn. Has stopped running ( has some pevic support issues)  Has tried some advill which has helped   She is having some palpitations  Only last for a few seconds.  We discussed having her wear a monitor but she is not interested in having a monitor at this time.  She will let us know if these palpitations worsen.  March 03, 2022 Kim Lee is seen for follow up of her HLD and costochondritis Still having CP  - with exercise or without  Coronary calcium score is 0  CT of her spine incidentally found a  trivial - small pericardial effusion  Does not seem to be pleuritic ( but does occur with running  Has also tended to get worse with lying back   Previously saw [facility] Her doctor retired and she was "kicked out of the practice)"         Past Medical History:  Diagnosis Date   Atypical chest pain    last chest pain spring 2022 and saw dr Cathie Olden, has never used nitro for   Family history of premature CAD    Hypercholesterolemia    Hyperlipidemia    Thyroid nodule    lov dr balan 03-20-2021   Thyroiditis 2018   tx with steroids lov q year dr Toni Amend 03-20-2021   Wears contact lenses 03/21/2021   Wears glasses 03/21/2021    Past Surgical History:  Procedure Laterality Date   ANTERIOR AND POSTERIOR REPAIR WITH SACROSPINOUS FIXATION N/A 03/26/2021   Procedure: ANTERIOR AND POSTERIOR REPAIR WITH SACROSPINOUS FIXATION;  Surgeon: [name], MD;  Location: [facility];  Service: Gynecology;  Laterality: N/A;   CYSTOSCOPY  03/26/2021   Procedure: CYSTOSCOPY;  Surgeon: [name], MD;  Location: [facility];  Service: Gynecology;;   LAPAROSCOPIC VAGINAL HYSTERECTOMY WITH SALPINGO OOPHORECTOMY Bilateral 03/26/2021   Procedure: LAPAROSCOPIC ASSISTED VAGINAL HYSTERECTOMY WITH BILATERAL SALPINGO OOPHORECTOMY;  Surgeon: [name], MD;  Location: [facility];  Service: Gynecology;  Laterality: Bilateral;  abdomen   MASS EXCISION  07/30/2011   Procedure: EXCISION MASS;  Surgeon: Wynonia Sours, MD;  Location: [facility];  Service: Orthopedics;  Laterality: Left;  left hand   TUBAL LIGATION  09/16/1999   WISDOM TOOTH EXTRACTION     AS TEENAGER     Current Outpatient Medications  Medication Sig Dispense Refill   colchicine 0.6 MG tablet Take 1 tablet (0.6 mg total) by mouth daily. 60 tablet 3   estradiol (ESTRACE) 0.1 MG/GM vaginal cream Place vaginally.     ezetimibe (ZETIA) 10 MG tablet Take 1 tablet (10 mg  total) by mouth daily. 90 tablet 3   nitroGLYCERIN (NITROSTAT) 0.4 MG SL tablet Place 1 tablet (0.4 mg total) under the tongue every 5 (five) minutes as needed for chest pain. 25 tablet 6   rosuvastatin (CRESTOR) 5 MG tablet Take 1 tablet (5 mg total) by mouth daily. 90 tablet 3   meloxicam (MOBIC) 15 MG tablet Take 15 mg by mouth daily. (Patient not taking: Reported on 03/03/2022)     No current facility-administered medications for this visit.  Allergies:   Lipitor [atorvastatin]    Social History:  The patient  reports that she quit smoking about 22 years ago. Her smoking use included cigarettes. She has a 15.00 pack-year smoking history. She has never used smokeless tobacco. She reports current alcohol use. She reports that she does not use drugs.   Family History:  The patient's family history includes Cancer in her father, mother, sister, and sister; Coronary artery disease in her mother; Diabetes in her father; Hypertension in her mother and sister. her mother had multiple stents after age 76   ROS:  Please see the history of present illness.   Otherwise, review of systems are positive for none.   All other systems are reviewed and negative.   Physical Exam: Blood pressure 120/72, pulse 66, height '5\' 6"'$  (1.676 m), weight 154 lb 6.4 oz (70 kg), SpO2 98 %.  GEN:  Well nourished, well developed in no acute distress HEENT: Normal NECK: No JVD; No carotid bruits LYMPHATICS: No lymphadenopathy CARDIAC: RRR  ,  no rubs, gallops RESPIRATORY:  Clear to auscultation without rales, wheezing or rhonchi  ABDOMEN: Soft, non-tender, non-distended MUSCULOSKELETAL:  No edema; No deformity  SKIN: Warm and dry NEUROLOGIC:  Alert and oriented x 3  EKG:    March 03, 2022: Normal sinus rhythm at 66.  Normal EKG.  Recent Labs: 03/22/2021: ALT 16; BUN 18; Creatinine, Ser 0.87; Potassium 3.7; Sodium 138 03/27/2021: Hemoglobin 10.6; Platelets 231    Lipid Panel    Component Value Date/Time   CHOL  189 01/04/2021 0855   TRIG 93 01/04/2021 0855   HDL 59 01/04/2021 0855   CHOLHDL 3.2 01/04/2021 0855   CHOLHDL 2 10/19/2020 1041   VLDL 20.2 10/19/2020 1041   LDLCALC 113 (H) 01/04/2021 0855      Wt Readings from Last 3 Encounters:  03/03/22 154 lb 6.4 oz (70 kg)  03/26/21 156 lb 1.6 oz (70.8 kg)  03/22/21 158 lb (71.7 kg)         ASSESSMENT AND PLAN:  1.  Hypercholesterolemia  :   2.  Chest discomfort: Coronary CT angiogram revealed a coronary calcium score of 0.  Her coronary arteries are normal.    She was incidentally found to have a small pericardial effusion by CT scan.  I plan to get an echocardiogram for further assessment of this.  We will start her on colchicine 0.6 mg twice a day.  I will see her back in the office in 3 months for follow-up evaluation.         Current medicines are reviewed at length with the patient today.  The patient does not have concerns regarding medicines.  The following changes have been made:  no change  Labs/ tests ordered today include:   Orders Placed This Encounter  Procedures   Comprehensive metabolic panel   Lipid panel   Ambulatory referral to Rheumatology   EKG 12-Lead   ECHOCARDIOGRAM COMPLETE     Mertie Moores, MD  03/03/2022 5:45 PM    [facility] [address]  [place], [place]  [address] Pager [phone] Phone: [phone]; Fax: [phone]

## 2022-03-03 ENCOUNTER — Encounter: Payer: Self-pay | Admitting: Cardiovascular Disease

## 2022-03-03 ENCOUNTER — Ambulatory Visit: Payer: BC Managed Care – PPO | Admitting: Cardiovascular Disease

## 2022-03-03 VITALS — BP 120/72 | HR 66 | Ht 66.0 in | Wt 154.4 lb

## 2022-03-03 DIAGNOSIS — R0781 Pleurodynia: Secondary | ICD-10-CM | POA: Diagnosis not present

## 2022-03-03 DIAGNOSIS — I3139 Other pericardial effusion (noninflammatory): Secondary | ICD-10-CM

## 2022-03-03 DIAGNOSIS — E782 Mixed hyperlipidemia: Secondary | ICD-10-CM | POA: Diagnosis not present

## 2022-03-03 MED ORDER — COLCHICINE 0.6 MG PO TABS: 0.6000 mg | ORAL_TABLET | Freq: Every day | ORAL | 3 refills | Status: DC

## 2022-03-03 NOTE — Patient Instructions (Signed)
Medication Instructions:  START Colchicine 0.'6mg'$  twice daily *If you need a refill on your cardiac medications before your next appointment, please call your pharmacy*   Lab Work: LIPIDS, CMET Tomorrow If you have labs (blood work) drawn today and your tests are completely normal, you will receive your results only by: [place] (if you have MyChart) OR A paper copy in the mail If you have any lab test that is abnormal or we need to change your treatment, we will call you to review the results.   Testing/Procedures: ECHO Your physician has requested that you have an echocardiogram. Echocardiography is a painless test that uses sound waves to create images of your heart. It provides your doctor with information about the size and shape of your heart and how well your heart's chambers and valves are working. This procedure takes approximately one hour. There are no restrictions for this procedure.  Ambulatory referral to Rheumatology   Follow-Up: At [facility], you and your health needs are our priority.  As part of our continuing mission to provide you with exceptional heart care, we have created designated Provider Care Teams.  These Care Teams include your primary Cardiologist (physician) and Advanced Practice Providers (APPs -  Physician Assistants and Nurse Practitioners) who all work together to provide you with the care you need, when you need it.  Your next appointment:   3 month(s)  The format for your next appointment:   In Person  Provider:   Mertie Moores, MD       Important Information About Sugar

## 2022-03-04 ENCOUNTER — Other Ambulatory Visit: Payer: BC Managed Care – PPO

## 2022-03-04 ENCOUNTER — Other Ambulatory Visit: Payer: Self-pay | Admitting: Endocrinology

## 2022-03-04 DIAGNOSIS — R0781 Pleurodynia: Secondary | ICD-10-CM

## 2022-03-04 DIAGNOSIS — I3139 Other pericardial effusion (noninflammatory): Secondary | ICD-10-CM

## 2022-03-04 DIAGNOSIS — E069 Thyroiditis, unspecified: Secondary | ICD-10-CM

## 2022-03-04 DIAGNOSIS — E782 Mixed hyperlipidemia: Secondary | ICD-10-CM

## 2022-03-05 LAB — COMPREHENSIVE METABOLIC PANEL
ALT: 16 IU/L (ref 0–32)
AST: 17 IU/L (ref 0–40)
Albumin/Globulin Ratio: 2.5 — ABNORMAL HIGH (ref 1.2–2.2)
Albumin: 4.8 g/dL (ref 3.8–4.9)
Alkaline Phosphatase: 66 IU/L (ref 44–121)
BUN/Creatinine Ratio: 16 (ref 9–23)
BUN: 13 mg/dL (ref 6–24)
Bilirubin Total: 0.2 mg/dL (ref 0.0–1.2)
CO2: 24 mmol/L (ref 20–29)
Calcium: 9.3 mg/dL (ref 8.7–10.2)
Chloride: 105 mmol/L (ref 96–106)
Creatinine, Ser: 0.81 mg/dL (ref 0.57–1.00)
Globulin, Total: 1.9 g/dL (ref 1.5–4.5)
Glucose: 96 mg/dL (ref 70–99)
Potassium: 4.2 mmol/L (ref 3.5–5.2)
Sodium: 142 mmol/L (ref 134–144)
Total Protein: 6.7 g/dL (ref 6.0–8.5)
eGFR: 86 mL/min/{1.73_m2} (ref >59)

## 2022-03-05 LAB — LIPID PANEL
Chol/HDL Ratio: 3.7 ratio (ref 0.0–4.4)
Cholesterol, Total: 200 mg/dL — ABNORMAL HIGH (ref 100–199)
HDL: 54 mg/dL (ref >39)
LDL Chol Calc (NIH): 118 mg/dL — ABNORMAL HIGH (ref 0–99)
Triglycerides: 158 mg/dL — ABNORMAL HIGH (ref 0–149)
VLDL Cholesterol Cal: 28 mg/dL (ref 5–40)

## 2022-03-17 ENCOUNTER — Telehealth: Payer: Self-pay

## 2022-03-17 DIAGNOSIS — I3139 Other pericardial effusion (noninflammatory): Secondary | ICD-10-CM

## 2022-03-17 DIAGNOSIS — R0781 Pleurodynia: Secondary | ICD-10-CM

## 2022-03-17 NOTE — Telephone Encounter (Signed)
Original referral denied by Dr Estanislado Pandy stating that pt should be seen urgently and they had no openings/availability. Per [place], please see if we can get pt seen at a different office. Second referral placed at this time.

## 2022-03-24 ENCOUNTER — Ambulatory Visit (HOSPITAL_COMMUNITY): Payer: BC Managed Care – PPO | Attending: Cardiology

## 2022-03-24 DIAGNOSIS — R0781 Pleurodynia: Secondary | ICD-10-CM | POA: Insufficient documentation

## 2022-03-24 DIAGNOSIS — I358 Other nonrheumatic aortic valve disorders: Secondary | ICD-10-CM

## 2022-03-24 DIAGNOSIS — I3139 Other pericardial effusion (noninflammatory): Secondary | ICD-10-CM | POA: Insufficient documentation

## 2022-03-24 DIAGNOSIS — E782 Mixed hyperlipidemia: Secondary | ICD-10-CM | POA: Insufficient documentation

## 2022-03-24 LAB — ECHOCARDIOGRAM COMPLETE
Area-P 1/2: 2.24 cm2
S' Lateral: 3.2 cm

## 2022-03-27 ENCOUNTER — Telehealth: Payer: Self-pay

## 2022-03-27 MED ORDER — COLCHICINE 0.6 MG PO TABS: ORAL_TABLET | ORAL | 6 refills | Status: DC

## 2022-03-27 NOTE — Telephone Encounter (Signed)
Contacted patient who states she has been taking the Colchicine 0.'6mg'$  once daily since he placed her on it at her OV and has not had any relief. She understands to increase to twice daily beginning today and will report back to [place] in 3 weeks to let us know if this has helped her pleuritic chest pain. New rx sent to pharmacy on file.

## 2022-03-27 NOTE — Telephone Encounter (Signed)
-----   Message from Thayer Headings, MD sent at 03/25/2022 12:51 PM EDT ----- Normal LV function Small ( and insignificant ) pericardial effusion but this may be from a viral pericardits .  She was having some pleuretic CP Lets have her take colchicine 0.6 mg BID for 3-4 weeks.   Would give 60 tabs with 6 refills .   See if this resolves her CP.   I would like for her to let us know how she is doing in several weeks.

## 2022-04-07 ENCOUNTER — Ambulatory Visit (INDEPENDENT_AMBULATORY_CARE_PROVIDER_SITE_OTHER): Payer: BC Managed Care – PPO

## 2022-04-07 DIAGNOSIS — M722 Plantar fascial fibromatosis: Secondary | ICD-10-CM

## 2022-04-07 NOTE — Progress Notes (Signed)
Patient presents today to pick up custom molded foot orthotics, diagnosed with plantar fasciitis by Dr. Milinda Pointer.   Orthotics were dispensed and fit was satisfactory. Reviewed instructions for break-in and wear. Written instructions given to patient.  Patient will follow up as needed.   [facility] - order # [id number]

## 2022-06-06 ENCOUNTER — Ambulatory Visit: Payer: BC Managed Care – PPO | Admitting: Cardiovascular Disease

## 2022-06-23 ENCOUNTER — Ambulatory Visit: Payer: BC Managed Care – PPO | Admitting: Cardiovascular Disease

## 2022-09-15 ENCOUNTER — Ambulatory Visit: Admission: EM | Admit: 2022-09-15 | Payer: BC Managed Care – PPO | Source: Home / Self Care

## 2022-09-15 ENCOUNTER — Emergency Department: Payer: BC Managed Care – PPO

## 2022-09-15 ENCOUNTER — Emergency Department
Admission: EM | Admit: 2022-09-15 | Discharge: 2022-09-15 | Disposition: A | Discharge status: Home / Self Care | Payer: BC Managed Care – PPO | Attending: Emergency Medicine | Admitting: Emergency Medicine

## 2022-09-15 ENCOUNTER — Other Ambulatory Visit: Payer: Self-pay

## 2022-09-15 DIAGNOSIS — Z1152 Encounter for screening for COVID-19: Secondary | ICD-10-CM | POA: Insufficient documentation

## 2022-09-15 DIAGNOSIS — N390 Urinary tract infection, site not specified: Secondary | ICD-10-CM | POA: Diagnosis not present

## 2022-09-15 DIAGNOSIS — J101 Influenza due to other identified influenza virus with other respiratory manifestations: Secondary | ICD-10-CM

## 2022-09-15 DIAGNOSIS — R509 Fever, unspecified: Secondary | ICD-10-CM | POA: Diagnosis present

## 2022-09-15 LAB — URINALYSIS, ROUTINE W REFLEX MICROSCOPIC
Bilirubin Urine: NEGATIVE
Glucose, UA: NEGATIVE mg/dL
Hgb urine dipstick: NEGATIVE
Ketones, ur: NEGATIVE mg/dL
Nitrite: POSITIVE — AB
Protein, ur: 30 mg/dL — AB
Specific Gravity, Urine: 1.028 (ref 1.005–1.030)
WBC, UA: >50 WBC/hpf — ABNORMAL HIGH (ref 0–5)
pH: 5 (ref 5.0–8.0)

## 2022-09-15 LAB — CBC WITH DIFFERENTIAL/PLATELET
Abs Immature Granulocytes: 0.17 10*3/uL — ABNORMAL HIGH (ref 0.00–0.07)
Basophils Absolute: 0 10*3/uL (ref 0.0–0.1)
Basophils Relative: 1 %
Eosinophils Absolute: 0.1 10*3/uL (ref 0.0–0.5)
Eosinophils Relative: 2 %
HCT: 39.6 % (ref 36.0–46.0)
Hemoglobin: 12.9 g/dL (ref 12.0–15.0)
Immature Granulocytes: 4 %
Lymphocytes Relative: 23 %
Lymphs Abs: 0.9 10*3/uL (ref 0.7–4.0)
MCH: 28.5 pg (ref 26.0–34.0)
MCHC: 32.6 g/dL (ref 30.0–36.0)
MCV: 87.4 fL (ref 80.0–100.0)
Monocytes Absolute: 0.7 10*3/uL (ref 0.1–1.0)
Monocytes Relative: 16 %
Neutro Abs: 2.2 10*3/uL (ref 1.7–7.7)
Neutrophils Relative %: 54 %
Platelets: 196 10*3/uL (ref 150–400)
RBC: 4.53 MIL/uL (ref 3.87–5.11)
RDW: 11.9 % (ref 11.5–15.5)
WBC: 4.1 10*3/uL (ref 4.0–10.5)
nRBC: 0 % (ref 0.0–0.2)

## 2022-09-15 LAB — RESP PANEL BY RT-PCR (RSV, FLU A&B, COVID)  RVPGX2
Influenza A by PCR: POSITIVE — AB
Influenza B by PCR: NEGATIVE
Resp Syncytial Virus by PCR: NEGATIVE
SARS Coronavirus 2 by RT PCR: NEGATIVE

## 2022-09-15 LAB — COMPREHENSIVE METABOLIC PANEL
ALT: 31 U/L (ref 0–44)
AST: 25 U/L (ref 15–41)
Albumin: 4.2 g/dL (ref 3.5–5.0)
Alkaline Phosphatase: 55 U/L (ref 38–126)
Anion gap: 7 (ref 5–15)
BUN: 13 mg/dL (ref 6–20)
CO2: 27 mmol/L (ref 22–32)
Calcium: 8.7 mg/dL — ABNORMAL LOW (ref 8.9–10.3)
Chloride: 105 mmol/L (ref 98–111)
Creatinine, Ser: 0.8 mg/dL (ref 0.44–1.00)
GFR, Estimated: >60 mL/min (ref >60)
Glucose, Bld: 108 mg/dL — ABNORMAL HIGH (ref 70–99)
Potassium: 3.5 mmol/L (ref 3.5–5.1)
Sodium: 139 mmol/L (ref 135–145)
Total Bilirubin: 0.5 mg/dL (ref 0.3–1.2)
Total Protein: 7.5 g/dL (ref 6.5–8.1)

## 2022-09-15 MED ORDER — CEPHALEXIN 500 MG PO CAPS: 500.0000 mg | ORAL_CAPSULE | Freq: Three times a day (TID) | ORAL | 0 refills | Status: DC

## 2022-09-15 MED ORDER — SODIUM CHLORIDE 0.9 % IV BOLUS
1000.0000 mL | Freq: Once | INTRAVENOUS | Status: AC
  Administered 2022-09-15: 1000 mL via INTRAVENOUS

## 2022-09-15 MED ORDER — ONDANSETRON HCL 4 MG/2ML IJ SOLN
4.0000 mg | Freq: Once | INTRAMUSCULAR | Status: AC
  Administered 2022-09-15: 4 mg via INTRAVENOUS
  Filled 2022-09-15: qty 2

## 2022-09-15 MED ORDER — SODIUM CHLORIDE 0.9 % IV SOLN
1.0000 g | Freq: Once | INTRAVENOUS | Status: AC
  Administered 2022-09-15: 1 g via INTRAVENOUS
  Filled 2022-09-15: qty 10

## 2022-09-15 MED ORDER — ONDANSETRON 4 MG PO TBDP: 4.0000 mg | ORAL_TABLET | Freq: Three times a day (TID) | ORAL | 0 refills | Status: DC | PRN

## 2022-09-15 MED ORDER — KETOROLAC TROMETHAMINE 30 MG/ML IJ SOLN
30.0000 mg | Freq: Once | INTRAMUSCULAR | Status: AC
  Administered 2022-09-15: 30 mg via INTRAVENOUS
  Filled 2022-09-15: qty 1

## 2022-09-15 MED ORDER — KETOROLAC TROMETHAMINE 10 MG PO TABS: 10.0000 mg | ORAL_TABLET | Freq: Four times a day (QID) | ORAL | 0 refills | Status: DC | PRN

## 2022-09-15 NOTE — ED Triage Notes (Signed)
Pt reports pain to her mid to lower abd and and back. Pt reports also with fever. Pt concerned she has a UTI

## 2022-09-15 NOTE — ED Notes (Signed)
No computer for sign out for discharge in room iv dc'ed  discharge inst to pt.

## 2022-09-15 NOTE — Discharge Instructions (Addendum)
Your CT scan today shows an 8.4 cm septated cyst of the left kidney.  As we discussed we would recommend following up with a primary care doctor for an MRI with and without contrast in 2 to 4 weeks to further evaluate.  Please call the number provided to arrange a primary care appointment please inform them of your need for an MRI within the next 2 to 4 weeks.  Please take your medications as prescribed, as needed.  Please take your entire course of antibiotics.  Return to the emergency department for any symptom personally concerning to yourself.

## 2022-09-15 NOTE — ED Provider Notes (Signed)
[facility] Provider Note    Event Date/Time   First MD Initiated Contact with Patient 09/15/22 1612     (approximate)  History   Chief Complaint: Back Pain and Fever  HPI  Kim Lee is a 57 y.o. female with a past medical history of hyperlipidemia who presents to the emergency department for urinary burning subjective fever at home and lower back pain.  According to the patient for the past several days she has had burning with urination and pain in her lower back.  She also states she has been coughing for the past 2 weeks but has some sinus pressure and pain as well.  Has not measured a fever.  States nausea but no vomiting.  Physical Exam   Triage Vital Signs: ED Triage Vitals  Enc Vitals Group     BP 09/15/22 1547 (!) 147/74     Pulse Rate 09/15/22 1547 88     Resp 09/15/22 1547 20     Temp 09/15/22 1547 99.6 F (37.6 C)     Temp src --      SpO2 09/15/22 1547 97 %     Weight 09/15/22 1540 154 lb 5.2 oz (70 kg)     Height 09/15/22 1540 '5\' 6"'$  (1.676 m)     Head Circumference --      Peak Flow --      Pain Score 09/15/22 1540 4     Pain Loc --      Pain Edu? --      Excl. in [place]? --     Most recent vital signs: Vitals:   09/15/22 1547  BP: (!) 147/74  Pulse: 88  Resp: 20  Temp: 99.6 F (37.6 C)  SpO2: 97%    General: Awake, no distress.  CV:  Good peripheral perfusion.  Regular rate and rhythm  Resp:  Normal effort.  Equal breath sounds bilaterally.  Abd:  No distention.  Soft, mild suprapubic tenderness.  Patient states more pain in her lower back.   ED Results / Procedures / Treatments   RADIOLOGY  I have reviewed and interpreted CT images.  Patient appears to have a very large left renal cyst. Radiology shows no urinary tract stones, does have an 8.4 cm septated left renal cyst.  I discussed this with the patient as far as MRI follow-up in 2 to 4 weeks.   MEDICATIONS ORDERED IN ED: Medications  sodium chloride 0.9  % bolus 1,000 mL (has no administration in time range)  ondansetron (ZOFRAN) injection 4 mg (has no administration in time range)  cefTRIAXone (ROCEPHIN) 1 g in sodium chloride 0.9 % 100 mL IVPB (has no administration in time range)  ketorolac (TORADOL) 30 MG/ML injection 30 mg (has no administration in time range)     IMPRESSION / MDM / ASSESSMENT AND PLAN / ED COURSE  I reviewed the triage vital signs and the nursing notes.  Patient's presentation is most consistent with acute presentation with potential threat to life or bodily function.  Patient presents emergency department for burning with urination and lower back pain subjective fever at home and nausea.  Patient has a temperature 99.6 in the emergency department otherwise reassuring vitals.  Patient does have mild suprapubic tenderness as well as back pain.  Given the patient's symptoms urinary tract infection would be very high in the differential possible pyelonephritis.  Patient also states cough and congestion for the past 1 to 2 weeks we will check for COVID/flu/RSV  which could be contributing to the patient's discomfort.  Patient's urinalysis shows greater than 50 white cells with many bacteria and nitrite positive indicating urinary tract infection.  We will send urine culture we will dose IV Rocephin we will treat with Toradol and Zofran and IV fluids while awaiting labs.  Given the patient's back pain and urine findings we will also obtain CT imaging to further evaluate to rule out ureterolithiasis or other more concerning finding.  Patient agreeable to plan of care. Patient CT scan shows no concerning abnormality from an acute standpoint however she will need MRI follow-up in 2 to 4 weeks to evaluate the left renal septated cyst.  I discussed this with the patient.  Will refer to [facility] as well.  Patient's influenza A test is positive.  Reassuringly patient's CBC is normal with normal white blood cell count and is overall  normal as well.  Suspect that the patient's symptoms are due to a combination of both influenza A as well as a urinary tract infection.  Will discharge on antibiotics, Toradol and Zofran I discussed supportive care at home as well as return precautions.  Patient agreeable to plan of care.  FINAL CLINICAL IMPRESSION(S) / ED DIAGNOSES   Urinary tract infection Influenza A  Rx / DC Orders   Keflex Toradol Zofran  Note:  This document was prepared using Dragon voice recognition software and may include unintentional dictation errors.   Harvest Dark, MD 09/15/22 1724

## 2022-09-18 LAB — URINE CULTURE: Culture: >=100000 — AB

## 2022-09-23 ENCOUNTER — Other Ambulatory Visit: Payer: Self-pay | Admitting: Cardiovascular Disease

## 2022-09-30 ENCOUNTER — Other Ambulatory Visit: Payer: Self-pay | Admitting: Urology

## 2022-09-30 DIAGNOSIS — N281 Cyst of kidney, acquired: Secondary | ICD-10-CM

## 2022-10-23 ENCOUNTER — Other Ambulatory Visit: Payer: BC Managed Care – PPO

## 2022-12-20 ENCOUNTER — Ambulatory Visit
Admission: RE | Admit: 2022-12-20 | Discharge: 2022-12-20 | Disposition: A | Discharge status: Home / Self Care | Payer: BC Managed Care – PPO | Source: Ambulatory Visit | Attending: Urology | Admitting: Urology

## 2022-12-20 DIAGNOSIS — N281 Cyst of kidney, acquired: Secondary | ICD-10-CM

## 2022-12-20 MED ORDER — GADOPICLENOL 0.5 MMOL/ML IV SOLN
8.0000 mL | Freq: Once | INTRAVENOUS | Status: AC | PRN
  Administered 2022-12-20: 8 mL via INTRAVENOUS

## 2023-01-29 ENCOUNTER — Other Ambulatory Visit: Payer: Self-pay | Admitting: Obstetrics and Gynecology

## 2023-01-29 DIAGNOSIS — Z800 Family history of malignant neoplasm of digestive organs: Secondary | ICD-10-CM

## 2023-02-24 ENCOUNTER — Encounter: Payer: Self-pay | Admitting: Podiatry

## 2023-02-24 ENCOUNTER — Ambulatory Visit: Payer: BC Managed Care – PPO | Admitting: Podiatry

## 2023-02-24 DIAGNOSIS — L600 Ingrowing nail: Secondary | ICD-10-CM | POA: Diagnosis not present

## 2023-02-24 NOTE — Progress Notes (Unsigned)
She presents today chief complaint of a ingrown toenail to the fibular border of the third toe left foot.  States has been bothering her now quite some time.  Objective: Also stable oriented x 3 there is mild erythema there is mild mallet toe deformity third left with a reactive hyperkeratotic lesion along the lateral aspect.  Assessment: Ingrown nail fibular border third digit left foot.  Plan: Discussed etiology pathology conservative therapies chemical matricectomy was performed today tolerated procedure well after local anesthetic was administered she was given verbal and written home-going instruction for care and soaking of the toe I will follow-up with her in 2 weeks

## 2023-02-24 NOTE — Patient Instructions (Signed)

## 2023-02-25 ENCOUNTER — Ambulatory Visit
Admission: RE | Admit: 2023-02-25 | Discharge: 2023-02-25 | Disposition: A | Discharge status: Home / Self Care | Payer: BC Managed Care – PPO | Source: Ambulatory Visit | Attending: Endocrinology | Admitting: Endocrinology

## 2023-02-25 DIAGNOSIS — E069 Thyroiditis, unspecified: Secondary | ICD-10-CM

## 2023-03-02 ENCOUNTER — Other Ambulatory Visit: Payer: BC Managed Care – PPO

## 2023-03-04 ENCOUNTER — Ambulatory Visit
Admission: RE | Admit: 2023-03-04 | Discharge: 2023-03-04 | Disposition: A | Discharge status: Home / Self Care | Payer: BC Managed Care – PPO | Source: Ambulatory Visit | Attending: Obstetrics and Gynecology | Admitting: Obstetrics and Gynecology

## 2023-03-04 DIAGNOSIS — Z800 Family history of malignant neoplasm of digestive organs: Secondary | ICD-10-CM

## 2023-03-04 MED ORDER — GADOPICLENOL 0.5 MMOL/ML IV SOLN
7.0000 mL | Freq: Once | INTRAVENOUS | Status: AC | PRN
  Administered 2023-03-04: 7 mL via INTRAVENOUS

## 2023-03-12 ENCOUNTER — Ambulatory Visit: Payer: BC Managed Care – PPO | Admitting: Podiatry

## 2023-03-13 LAB — LAB REPORT - SCANNED
A1c: 5.6
EGFR: 72

## 2023-03-25 ENCOUNTER — Other Ambulatory Visit: Payer: Self-pay | Admitting: Endocrinology

## 2023-03-25 DIAGNOSIS — E041 Nontoxic single thyroid nodule: Secondary | ICD-10-CM

## 2023-04-03 ENCOUNTER — Other Ambulatory Visit: Payer: BC Managed Care – PPO

## 2023-04-03 ENCOUNTER — Encounter: Payer: BC Managed Care – PPO | Admitting: Hematology and Oncology

## 2023-04-07 ENCOUNTER — Inpatient Hospital Stay: Payer: BC Managed Care – PPO

## 2023-04-07 ENCOUNTER — Other Ambulatory Visit: Payer: Self-pay

## 2023-04-07 ENCOUNTER — Inpatient Hospital Stay: Payer: BC Managed Care – PPO | Attending: Hematology and Oncology | Admitting: Hematology and Oncology

## 2023-04-07 VITALS — BP 136/80 | HR 69 | Temp 98.1°F | Resp 19 | Wt 167.3 lb

## 2023-04-07 DIAGNOSIS — Z9189 Other specified personal risk factors, not elsewhere classified: Secondary | ICD-10-CM

## 2023-04-07 DIAGNOSIS — Z90710 Acquired absence of both cervix and uterus: Secondary | ICD-10-CM | POA: Insufficient documentation

## 2023-04-07 DIAGNOSIS — D039 Melanoma in situ, unspecified: Secondary | ICD-10-CM | POA: Insufficient documentation

## 2023-04-07 DIAGNOSIS — Z1501 Genetic susceptibility to malignant neoplasm of breast: Secondary | ICD-10-CM | POA: Insufficient documentation

## 2023-04-07 DIAGNOSIS — E785 Hyperlipidemia, unspecified: Secondary | ICD-10-CM | POA: Diagnosis not present

## 2023-04-07 DIAGNOSIS — Z800 Family history of malignant neoplasm of digestive organs: Secondary | ICD-10-CM | POA: Insufficient documentation

## 2023-04-07 DIAGNOSIS — Z803 Family history of malignant neoplasm of breast: Secondary | ICD-10-CM | POA: Diagnosis not present

## 2023-04-07 NOTE — Progress Notes (Signed)
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  We have calculated her lifetime risk of breast cancer per Tyrer-Cuzick model which comes to around 37%.  We have also calculated her 5-year risk of breast cancer per Gail's model which comes to around 2.8%.  We discussed different risk reducing strategies for future breast cancer risk. We discussed chemoprevention and lifestyle modification. We discussed role of ongoing surveillance with mammograms versus MRIs. Patient would be a good candidate for chemoprevention with  tamoxifen or Evista or aromatase inhibitors.  We focused our discussion mostly on tamoxifen today.  With tamoxifen side effects would include but not limited to cataracts, increased risk of DVT/PE/cardiovascular events, uterine malignancy, hot flashes, and mood swings.  Benefit could be bone density improvement since she has osteopenia.  I have given her some printed information about tamoxifen today.  Lifestyle modification: We discussed different interventions exercise at least 5 days a week including both aerobic as well as weight-bearing exercises and strength training. He discussed dietary modification including increasing number of servings of fruit and vegetables as well as decreased in number of servings of meat. We discussed the importance of maintaining a good BMI and limiting alcohol intake.  Surveillance: Patient certainly would be a good candidate for ongoing  mammograms on a yearly basis. Certainly she could benefit from a 3-D mammogram. We discussed self breast examination as well as ongoing clinical examination.  We have discussed about role of MRIs in breast cancer screening.  We have discussed about increased sensitivity with MRIs however unknown long-term risk of gadolinium deposition and sometimes there may be additional need for biopsies.  She is not interested in pursuing MRIs at this time.  She wants to continue annual mammograms. She wants to think tamoxifen and keep [place] posted if she is interested in pursuing.  We have clearly discussed that tamoxifen will reduce the risk of ER positive breast cancers but there is no survival benefit.  Genetics: Genetic testing showed MITF mutation.  I do not believe there is conclusive evidence about MITF mutation increasing risk of breast cancer.  I would however recommend that she follow-up with genetic counselor as well for additional recommendations.  We have discussed about increased risk of melanoma with this gene.  She can continue follow-up for pancreatic cancer with her GI doctor, annual mammograms and annual dermatology exam.  Return to clinic for follow-up with [place] in 1 year.  HISTORY OF PRESENTING ILLNESS:  Kim Lee 57 y.o. female is here because of high risk for [facility].  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  She underwent genetic testing with Dr. Gaye Alken office and this showed mutation in MITF gene and hence she was referred to high risk breast clinic given increased risk of breast cancer possibly with this gene.  She is healthy at baseline,  teacher by occupation.  She had total hysterectomy couple years ago.  Besides the melanoma in situ, she has some dyslipidemia.  She is not a good pill taker and  apparently was having trouble taking her statins.  She denies any health complaints today.  She is here to review her lifetime risk and needed interventions.  No concerning ROS  MEDICAL HISTORY:  Past Medical History:  Diagnosis Date   Atypical chest pain    last chest pain spring 2022 and saw dr Melburn Popper, has never used nitro for   Family history of premature CAD    Hypercholesterolemia    Hyperlipidemia    Thyroid nodule    lov dr balan 03-20-2021   Thyroiditis 2018   tx with steroids lov q year dr Andria Meuse 03-20-2021   Wears contact lenses 03/21/2021   Wears glasses 03/21/2021    SURGICAL HISTORY: Past Surgical History:  Procedure Laterality Date   ANTERIOR AND POSTERIOR REPAIR WITH SACROSPINOUS FIXATION N/A 03/26/2021   Procedure: ANTERIOR AND POSTERIOR REPAIR WITH SACROSPINOUS FIXATION;  Surgeon: Richardean Chimera, MD;  Location: [facility];  Service: Gynecology;  Laterality: N/A;   CYSTOSCOPY  03/26/2021   Procedure: CYSTOSCOPY;  Surgeon: Richardean Chimera, MD;  Location: [facility];  Service: Gynecology;;   LAPAROSCOPIC VAGINAL HYSTERECTOMY WITH SALPINGO OOPHORECTOMY Bilateral 03/26/2021   Procedure: LAPAROSCOPIC ASSISTED VAGINAL HYSTERECTOMY WITH BILATERAL SALPINGO OOPHORECTOMY;  Surgeon: Richardean Chimera, MD;  Location: [facility] [place];  Service: Gynecology;  Laterality: Bilateral;  abdomen   MASS EXCISION  07/30/2011   Procedure: EXCISION MASS;  Surgeon: Nicki Reaper, MD;  Location: [facility];  Service: Orthopedics;  Laterality: Left;  left hand   TUBAL LIGATION  09/16/1999   WISDOM TOOTH EXTRACTION     AS TEENAGER    SOCIAL HISTORY: Social History   Socioeconomic History   Marital status: Married    Spouse name: Not on file   Number of children: Not on file   Years of education: Not on file   Highest education level: Not on file  Occupational History   Not on file  Tobacco Use   Smoking status: Former    Current packs/day:  0.00    Average packs/day: 1 pack/day for 15.0 years (15.0 ttl pk-yrs)    Types: Cigarettes    Start date: 05/04/1984    Quit date: 05/05/1999    Years since quitting: 23.9   Smokeless tobacco: Never  Vaping Use   Vaping status: Never Used  Substance and Sexual Activity   Alcohol use: Yes    Comment: occ   Drug use: No   Sexual activity: Yes  Other Topics Concern   Not on file  Social History Narrative   Not on file   Social Determinants of Health   Financial Resource Strain: Not on file  Food Insecurity: Not on file  Transportation Needs: Not on file  Physical Activity: Not on file  Stress: Not on file  Social Connections: Unknown (01/24/2022)   Received from [facility]   Social Network    Social Network: Not on file  Intimate Partner Violence: Unknown (12/17/2021)   Received from [facility]   HITS    Physically Hurt: Not on file    Insult or Talk Down To: Not on file    Threaten Physical Harm: Not on file    Scream or Curse: Not on file    FAMILY HISTORY: Family History  Problem Relation Age of Onset  Diabetes Father    Cancer Father        pancreatic   Coronary artery disease Mother        with stents   Hypertension Mother    Cancer Mother        lung   Cancer Sister        breast   Cancer Sister        leukemia   Hypertension Sister     ALLERGIES:  is allergic to lipitor [atorvastatin] and mupirocin.  MEDICATIONS:  Current Outpatient Medications  Medication Sig Dispense Refill   colchicine 0.6 MG tablet Take 1 tablet by mouth twice daily for 3-4 weeks 60 tablet 6   estradiol (ESTRACE) 0.1 MG/GM vaginal cream Place vaginally.     ezetimibe (ZETIA) 10 MG tablet TAKE 1 TABLET BY MOUTH DAILY 90 tablet 3   ketorolac (TORADOL) 10 MG tablet Take 1 tablet (10 mg total) by mouth every 6 (six) hours as needed. 20 tablet 0   nitroGLYCERIN (NITROSTAT) 0.4 MG SL tablet Place 1 tablet (0.4 mg total) under the tongue every 5 (five) minutes as needed for chest  pain. 25 tablet 6   ondansetron (ZOFRAN-ODT) 4 MG disintegrating tablet Take 1 tablet (4 mg total) by mouth every 8 (eight) hours as needed for nausea or vomiting. 20 tablet 0   rosuvastatin (CRESTOR) 5 MG tablet TAKE 1 TABLET BY MOUTH DAILY 90 tablet 3   No current facility-administered medications for this visit.     PHYSICAL EXAMINATION: ECOG PERFORMANCE STATUS: 0 - Asymptomatic  Vitals:   04/07/23 1042  BP: 136/80  Pulse: 69  Resp: 19  Temp: 98.1 F (36.7 C)  SpO2: 96%   Filed Weights   04/07/23 1042  Weight: 167 lb 4.8 oz (75.9 kg)    GENERAL:alert, no distress and comfortable SKIN: skin color, texture, turgor are normal, no rashes or significant lesions EYES: normal, conjunctiva are pink and non-injected, sclera clear OROPHARYNX:no exudate, no erythema and lips, buccal mucosa, and tongue normal  NECK: supple, thyroid normal size, non-tender, without nodularity LYMPH:  no palpable lymphadenopathy in the cervical, axillary LUNGS: clear to auscultation and percussion with normal breathing effort HEART: regular rate & rhythm and no murmurs and no lower extremity edema ABDOMEN:abdomen soft, non-tender and normal bowel sounds Musculoskeletal:no cyanosis of digits and no clubbing  PSYCH: alert & oriented x 3 with fluent speech NEURO: no focal motor/sensory deficits  LABORATORY DATA:  I have reviewed the data as listed Lab Results  Component Value Date   WBC 4.1 09/15/2022   HGB 12.9 09/15/2022   HCT 39.6 09/15/2022   MCV 87.4 09/15/2022   PLT 196 09/15/2022     Chemistry      Component Value Date/Time   NA 139 09/15/2022 1605   NA 142 03/04/2022 0819   K 3.5 09/15/2022 1605   CL 105 09/15/2022 1605   CO2 27 09/15/2022 1605   BUN 13 09/15/2022 1605   BUN 13 03/04/2022 0819   CREATININE 0.80 09/15/2022 1605   CREATININE 0.77 06/04/2016 0734      Component Value Date/Time   CALCIUM 8.7 (L) 09/15/2022 1605   ALKPHOS 55 09/15/2022 1605   AST 25 09/15/2022  1605   ALT 31 09/15/2022 1605   BILITOT 0.5 09/15/2022 1605   BILITOT 0.2 03/04/2022 0819       RADIOGRAPHIC STUDIES: I have personally reviewed the radiological images as listed and agreed with the findings in the report. No results found.  All questions were answered. The patient knows to call the clinic with any problems, questions or concerns. I spent 45 minutes in the care of this patient including H and P, review of records, counseling and coordination of care.     Rachel Moulds, MD 04/07/2023 11:37 AM

## 2023-04-07 NOTE — Progress Notes (Deleted)
[facility] CONSULT NOTE  Patient Care Team: Pcp, No as PCP - General Nahser, Deloris Ping, MD as PCP - Cardiology (Cardiology)  CHIEF COMPLAINTS/PURPOSE OF CONSULTATION:  Newly diagnosed breast cancer  HISTORY OF PRESENTING ILLNESS:  Kim Lee 57 y.o. female is here because of recent diagnosis of {left/right:[id number]}   I reviewed her records extensively and collaborated the history with the patient.  SUMMARY OF ONCOLOGIC HISTORY: Oncology History   No history exists.     MEDICAL HISTORY:  Past Medical History:  Diagnosis Date   Atypical chest pain    last chest pain spring 2022 and saw dr Melburn Popper, has never used nitro for   Family history of premature CAD    Hypercholesterolemia    Hyperlipidemia    Thyroid nodule    lov dr balan 03-20-2021   Thyroiditis 2018   tx with steroids lov q year dr Andria Meuse 03-20-2021   Wears contact lenses 03/21/2021   Wears glasses 03/21/2021    SURGICAL HISTORY: Past Surgical History:  Procedure Laterality Date   ANTERIOR AND POSTERIOR REPAIR WITH SACROSPINOUS FIXATION N/A 03/26/2021   Procedure: ANTERIOR AND POSTERIOR REPAIR WITH SACROSPINOUS FIXATION;  Surgeon: Richardean Chimera, MD;  Location: [facility] [place];  Service: Gynecology;  Laterality: N/A;   CYSTOSCOPY  03/26/2021   Procedure: CYSTOSCOPY;  Surgeon: Richardean Chimera, MD;  Location: [facility];  Service: Gynecology;;   LAPAROSCOPIC VAGINAL HYSTERECTOMY WITH SALPINGO OOPHORECTOMY Bilateral 03/26/2021   Procedure: LAPAROSCOPIC ASSISTED VAGINAL HYSTERECTOMY WITH BILATERAL SALPINGO OOPHORECTOMY;  Surgeon: Richardean Chimera, MD;  Location: [facility] [place];  Service: Gynecology;  Laterality: Bilateral;  abdomen   MASS EXCISION  07/30/2011   Procedure: EXCISION MASS;  Surgeon: Nicki Reaper, MD;  Location: [facility];  Service: Orthopedics;  Laterality: Left;  left hand   TUBAL LIGATION  09/16/1999   WISDOM TOOTH EXTRACTION     AS  TEENAGER    SOCIAL HISTORY: Social History   Socioeconomic History   Marital status: Married    Spouse name: Not on file   Number of children: Not on file   Years of education: Not on file   Highest education level: Not on file  Occupational History   Not on file  Tobacco Use   Smoking status: Former    Current packs/day: 0.00    Average packs/day: 1 pack/day for 15.0 years (15.0 ttl pk-yrs)    Types: Cigarettes    Start date: 05/04/1984    Quit date: 05/05/1999    Years since quitting: 23.9   Smokeless tobacco: Never  Vaping Use   Vaping status: Never Used  Substance and Sexual Activity   Alcohol use: Yes    Comment: occ   Drug use: No   Sexual activity: Yes  Other Topics Concern   Not on file  Social History Narrative   Not on file   Social Determinants of Health   Financial Resource Strain: Not on file  Food Insecurity: Not on file  Transportation Needs: Not on file  Physical Activity: Not on file  Stress: Not on file  Social Connections: Unknown (01/24/2022)   Received [facility]   Social Network    Social Network: Not on file  Intimate Partner Violence: Unknown (12/17/2021)   Received from [facility]   HITS    Physically Hurt: Not on file    Insult or Talk Down To: Not on file    Threaten Physical Harm: Not on file  Scream or Curse: Not on file    FAMILY HISTORY: Family History  Problem Relation Age of Onset   Diabetes Father    Cancer Father        pancreatic   Coronary artery disease Mother        with stents   Hypertension Mother    Cancer Mother        lung   Cancer Sister        breast   Cancer Sister        leukemia   Hypertension Sister     ALLERGIES:  is allergic to lipitor [atorvastatin] and mupirocin.  MEDICATIONS:  Current Outpatient Medications  Medication Sig Dispense Refill   colchicine 0.6 MG tablet Take 1 tablet by mouth twice daily for 3-4 weeks 60 tablet 6   estradiol (ESTRACE) 0.1 MG/GM vaginal cream Place  vaginally.     ezetimibe (ZETIA) 10 MG tablet TAKE 1 TABLET BY MOUTH DAILY 90 tablet 3   ketorolac (TORADOL) 10 MG tablet Take 1 tablet (10 mg total) by mouth every 6 (six) hours as needed. 20 tablet 0   nitroGLYCERIN (NITROSTAT) 0.4 MG SL tablet Place 1 tablet (0.4 mg total) under the tongue every 5 (five) minutes as needed for chest pain. 25 tablet 6   ondansetron (ZOFRAN-ODT) 4 MG disintegrating tablet Take 1 tablet (4 mg total) by mouth every 8 (eight) hours as needed for nausea or vomiting. 20 tablet 0   rosuvastatin (CRESTOR) 5 MG tablet TAKE 1 TABLET BY MOUTH DAILY 90 tablet 3   No current facility-administered medications for this visit.    REVIEW OF SYSTEMS:   Constitutional: Denies fevers, chills or abnormal night sweats Eyes: Denies blurriness of vision, double vision or watery eyes Ears, nose, mouth, throat, and face: Denies mucositis or sore throat Respiratory: Denies cough, dyspnea or wheezes Cardiovascular: Denies palpitation, chest discomfort or lower extremity swelling Gastrointestinal:  Denies nausea, heartburn or change in bowel habits Skin: Denies abnormal skin rashes Lymphatics: Denies new lymphadenopathy or easy bruising Neurological:Denies numbness, tingling or new weaknesses Behavioral/Psych: Mood is stable, no new changes  Breast: *** Denies any palpable lumps or discharge All other systems were reviewed with the patient and are negative.  PHYSICAL EXAMINATION: ECOG PERFORMANCE STATUS: {CHL ONC ECOG GN:[id number]}  Vitals:   04/07/23 1042  BP: 136/80  Pulse: 69  Resp: 19  Temp: 98.1 F (36.7 C)  SpO2: 96%   Filed Weights   04/07/23 1042  Weight: 167 lb 4.8 oz (75.9 kg)    GENERAL:alert, no distress and comfortable SKIN: skin color, texture, turgor are normal, no rashes or significant lesions EYES: normal, conjunctiva are pink and non-injected, sclera clear OROPHARYNX:no exudate, no erythema and lips, buccal mucosa, and tongue normal  NECK: supple,  thyroid normal size, non-tender, without nodularity LYMPH:  no palpable lymphadenopathy in the cervical, axillary or inguinal LUNGS: clear to auscultation and percussion with normal breathing effort HEART: regular rate & rhythm and no murmurs and no lower extremity edema ABDOMEN:abdomen soft, non-tender and normal bowel sounds Musculoskeletal:no cyanosis of digits and no clubbing  PSYCH: alert & oriented x 3 with fluent speech NEURO: no focal motor/sensory deficits BREAST:*** No palpable nodules in breast. No palpable axillary or supraclavicular lymphadenopathy (exam performed in the presence of a chaperone)   LABORATORY DATA:  I have reviewed the data as listed Lab Results  Component Value Date   WBC 4.1 09/15/2022   HGB 12.9 09/15/2022   HCT 39.6 09/15/2022  MCV 87.4 09/15/2022   PLT 196 09/15/2022   Lab Results  Component Value Date   NA 139 09/15/2022   K 3.5 09/15/2022   CL 105 09/15/2022   CO2 27 09/15/2022    RADIOGRAPHIC STUDIES: I have personally reviewed the radiological reports and agreed with the findings in the report.  ASSESSMENT AND PLAN:  No problem-specific Assessment & Plan notes found for this encounter.  5 yr risk of BC 2.9% TC life time risk is approximately 37%.  All questions were answered. The patient knows to call the clinic with any problems, questions or concerns.    Rachel Moulds, MD 04/07/23

## 2023-04-17 ENCOUNTER — Other Ambulatory Visit: Payer: Self-pay | Admitting: Urology

## 2023-04-17 DIAGNOSIS — N281 Cyst of kidney, acquired: Secondary | ICD-10-CM

## 2023-06-12 ENCOUNTER — Other Ambulatory Visit: Payer: Self-pay | Admitting: Urology

## 2023-06-12 DIAGNOSIS — N281 Cyst of kidney, acquired: Secondary | ICD-10-CM

## 2023-06-22 NOTE — Progress Notes (Signed)
Chief Complaint: Patient was seen in virtual consultation today for left renal cyst  Referring Physician(s): Winter,Christopher Clifton Custard  History of Present Illness: Kim Lee is a 57 y.o. female with a medical history significant for hyperlipidemia, melanoma in situ and a left renal cyst first identified January 2024. She presented to the ED 09/15/22 with complaints of back pain, fever and burning with urination. Work up in the ED was positive for a UTI and a CT scan showed a left renal septated cyst. She was treated with supportive care in the ED and discharged with outpatient follow up.    She underwent outpatient MRI in April 2024 which characterized the cyst as a 9 cm Bosniak category 52F cystic lesion in the lower pole of the left kidney. Repeat imaging in June 2024 showed no significant change to the cyst.   She was referred to Interventional Radiology by her Urologist, Dr. Liliane Shi to discuss possible treatment options. She presents today via virtual telephone visit. She has experienced significant pain from the cyst which has required opioid pain medication.  The pain has slightly improved, requiring tylenol most days.  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  No fevers or chills.  No hematuria.  Past Medical History:  Diagnosis Date   Atypical chest pain    last chest pain spring 2022 and saw dr Melburn Popper, has never used nitro for   Family history of premature CAD    Hypercholesterolemia    Hyperlipidemia    Thyroid nodule    lov dr balan 03-20-2021   Thyroiditis 2018   tx with steroids lov q year dr Andria Meuse 03-20-2021   Wears contact lenses 03/21/2021   Wears glasses 03/21/2021    Past Surgical History:  Procedure Laterality Date   ANTERIOR AND POSTERIOR REPAIR WITH SACROSPINOUS FIXATION N/A 03/26/2021   Procedure: ANTERIOR AND POSTERIOR REPAIR WITH SACROSPINOUS FIXATION;  Surgeon: Richardean Chimera, MD;  Location: [facility];  Service: Gynecology;  Laterality: N/A;   CYSTOSCOPY  03/26/2021   Procedure: CYSTOSCOPY;  Surgeon: Richardean Chimera, MD;  Location: [facility];  Service: Gynecology;;   LAPAROSCOPIC VAGINAL HYSTERECTOMY WITH SALPINGO OOPHORECTOMY Bilateral 03/26/2021   Procedure: LAPAROSCOPIC ASSISTED VAGINAL HYSTERECTOMY WITH BILATERAL SALPINGO OOPHORECTOMY;  Surgeon: Richardean Chimera, MD;  Location: [facility] [place];  Service: Gynecology;  Laterality: Bilateral;  abdomen   MASS EXCISION  07/30/2011   Procedure: EXCISION MASS;  Surgeon: Nicki Reaper, MD;  Location: [facility];  Service: Orthopedics;  Laterality: Left;  left hand   TUBAL LIGATION  09/16/1999   WISDOM TOOTH EXTRACTION     AS TEENAGER    Allergies: Lipitor [atorvastatin] and Mupirocin  Medications: Prior to Admission medications   Medication Sig Start Date End Date Taking? Authorizing Provider  colchicine 0.6 MG tablet Take 1 tablet by mouth twice daily for 3-4 weeks 03/27/22   Nahser, Deloris Ping, MD  estradiol (ESTRACE) 0.1 MG/GM vaginal cream Place vaginally. 12/18/21   [provider]  ezetimibe (ZETIA) 10 MG tablet TAKE 1 TABLET BY MOUTH DAILY 09/23/22   Nahser, Deloris Ping, MD  ketorolac (TORADOL) 10 MG tablet Take 1 tablet (10 mg total) by mouth every 6 (six) hours as needed. 09/15/22   Minna Antis, MD  nitroGLYCERIN (NITROSTAT) 0.4 MG SL tablet Place 1 tablet (0.4 mg total) under the tongue every 5 (five) minutes as needed for chest pain. 11/15/19   Nahser, Loistine Chance  J, MD  ondansetron (ZOFRAN-ODT) 4 MG disintegrating tablet Take 1 tablet (4 mg total) by mouth every 8 (eight) hours as needed for nausea or vomiting. 09/15/22   Minna Antis, MD  rosuvastatin (CRESTOR) 5 MG tablet TAKE 1 TABLET BY MOUTH DAILY 09/23/22   Nahser, Deloris Ping, MD     Family History  Problem Relation Age of Onset   Diabetes Father    Cancer Father        pancreatic   Coronary artery disease Mother         with stents   Hypertension Mother    Cancer Mother        lung   Cancer Sister        breast   Cancer Sister        leukemia   Hypertension Sister     Social History   Socioeconomic History   Marital status: Married    Spouse name: Not on file   Number of children: Not on file   Years of education: Not on file   Highest education level: Not on file  Occupational History   Not on file  Tobacco Use   Smoking status: Former    Current packs/day: 0.00    Average packs/day: 1 pack/day for 15.0 years (15.0 ttl pk-yrs)    Types: Cigarettes    Start date: 05/04/1984    Quit date: 05/05/1999    Years since quitting: 24.1   Smokeless tobacco: Never  Vaping Use   Vaping status: Never Used  Substance and Sexual Activity   Alcohol use: Yes    Comment: occ   Drug use: No   Sexual activity: Yes  Other Topics Concern   Not on file  Social History Narrative   Not on file   Social Determinants of Health   Financial Resource Strain: Not on file  Food Insecurity: Not on file  Transportation Needs: Not on file  Physical Activity: Not on file  Stress: Not on file  Social Connections: Unknown (01/24/2022)   Received from [facility]   Social Network    Social Network: Not on file     Review of Systems: A 12 point ROS discussed and pertinent positives are indicated in the HPI above.  All other systems are negative.  Vital Signs: There were no vitals taken for this visit.  No physical exam was performed in lieu of virtual telephone visit.   Imaging: No results found.  Labs:  CBC: Recent Labs    09/15/22 1605  WBC 4.1  HGB 12.9  HCT 39.6  PLT 196    COAGS: No results for input(s): "INR", "APTT" in the last 8760 hours.  BMP: Recent Labs    09/15/22 1605  NA 139  K 3.5  CL 105  CO2 27  GLUCOSE 108*  BUN 13  CALCIUM 8.7*  CREATININE 0.80  GFRNONAA >60    LIVER FUNCTION TESTS: Recent Labs    09/15/22 1605  BILITOT 0.5  AST 25  ALT  31  ALKPHOS 55  PROT 7.5  ALBUMIN 4.2    TUMOR MARKERS: No results for input(s): "AFPTM", "CEA", "CA199", "CHROMGRNA" in the last 8760 hours.  Assessment and Plan:  57 year old female with a history of a painful, Bosniak category 31F cyst in the lower pole of the left kidney.  The cyst appears amenable for percutaneous aspiration, septation disruption, and ethanol sclerotherapy.   We discussed the rationale, risks, benefits, and periprocedural expectations of cyst aspiration with  sclerotherapy.  She would like to proceed.  Plan for ultrasound and fluoroscopic guided left renal cyst aspiration and ethanol sclerotherapy at [facility] with moderate sedation.    Marliss Coots, MD Pager: [phone]    I spent a total of  40 Minutes   in virtual telephone clinical consultation, greater than 50% of which was counseling/coordinating care for symptomatic left renal cyst.

## 2023-06-24 ENCOUNTER — Inpatient Hospital Stay
Admission: RE | Admit: 2023-06-24 | Discharge: 2023-06-24 | Disposition: A | Discharge status: Home / Self Care | Payer: BC Managed Care – PPO | Source: Ambulatory Visit | Attending: Urology

## 2023-06-24 DIAGNOSIS — N281 Cyst of kidney, acquired: Secondary | ICD-10-CM

## 2023-06-24 HISTORY — PX: IR RADIOLOGIST EVAL & MGMT: IMG5224

## 2023-06-27 ENCOUNTER — Other Ambulatory Visit: Payer: BC Managed Care – PPO

## 2023-06-29 ENCOUNTER — Other Ambulatory Visit (HOSPITAL_COMMUNITY): Payer: Self-pay | Admitting: Interventional Radiology

## 2023-06-29 DIAGNOSIS — N281 Cyst of kidney, acquired: Secondary | ICD-10-CM

## 2023-07-29 ENCOUNTER — Other Ambulatory Visit: Payer: Self-pay | Admitting: Radiology

## 2023-07-29 DIAGNOSIS — N281 Cyst of kidney, acquired: Secondary | ICD-10-CM

## 2023-07-29 NOTE — H&P (Signed)
Referring Physician(s): Winter,C  Supervising Physician: Marliss Coots  Patient Status:  [facility] OP  Chief Complaint:  Symptomatic left renal cyst  Subjective: Patient known to IR service from consultation with Dr. Elby Showers on 06/24/2023 to discuss treatment options for a symptomatic left renal cyst. She is a 57 year old female with past medical history significant for hyperlipidemia, thyroiditis 2018, melanoma in situ and a left renal cyst first identified in January of this year.  Workup at that time was positive for UTI and CT revealing a left renal septated cyst.  In April of this year patient underwent MRI which characterized the cyst as a 9 cm Bosniak category 63F cystic lesion in the lower pole left kidney with repeat imaging in June of this year showing no significant change to the cyst.  Following discussion with Dr. Elby Showers patient was deemed an appropriate candidate for image guided left renal cyst aspiration and sclerotherapy and presents today for the procedure.   Past Medical History:  Diagnosis Date   Atypical chest pain    last chest pain spring 2022 and saw dr Melburn Popper, has never used nitro for   Family history of premature CAD    Hypercholesterolemia    Hyperlipidemia    Thyroid nodule    lov dr balan 03-20-2021   Thyroiditis 2018   tx with steroids lov q year dr Andria Meuse 03-20-2021   Wears contact lenses 03/21/2021   Wears glasses 03/21/2021   Past Surgical History:  Procedure Laterality Date   ANTERIOR AND POSTERIOR REPAIR WITH SACROSPINOUS FIXATION N/A 03/26/2021   Procedure: ANTERIOR AND POSTERIOR REPAIR WITH SACROSPINOUS FIXATION;  Surgeon: Richardean Chimera, MD;  Location: [facility];  Service: Gynecology;  Laterality: N/A;   CYSTOSCOPY  03/26/2021   Procedure: CYSTOSCOPY;  Surgeon: Richardean Chimera, MD;  Location: [facility];  Service: Gynecology;;   IR RADIOLOGIST EVAL & MGMT  06/24/2023   LAPAROSCOPIC VAGINAL HYSTERECTOMY WITH SALPINGO  OOPHORECTOMY Bilateral 03/26/2021   Procedure: LAPAROSCOPIC ASSISTED VAGINAL HYSTERECTOMY WITH BILATERAL SALPINGO OOPHORECTOMY;  Surgeon: Richardean Chimera, MD;  Location: [facility] [place] [facility];  Service: Gynecology;  Laterality: Bilateral;  abdomen   MASS EXCISION  07/30/2011   Procedure: EXCISION MASS;  Surgeon: Nicki Reaper, MD;  Location: [facility];  Service: Orthopedics;  Laterality: Left;  left hand   TUBAL LIGATION  09/16/1999   WISDOM TOOTH EXTRACTION     AS TEENAGER      Allergies: Lipitor [atorvastatin] and Mupirocin  Medications: Prior to Admission medications   Medication Sig Start Date End Date Taking? Authorizing Provider  colchicine 0.6 MG tablet Take 1 tablet by mouth twice daily for 3-4 weeks 03/27/22   Nahser, Deloris Ping, MD  estradiol (ESTRACE) 0.1 MG/GM vaginal cream Place vaginally. 12/18/21   [provider]  ezetimibe (ZETIA) 10 MG tablet TAKE 1 TABLET BY MOUTH DAILY 09/23/22   Nahser, Deloris Ping, MD  ketorolac (TORADOL) 10 MG tablet Take 1 tablet (10 mg total) by mouth every 6 (six) hours as needed. 09/15/22   Minna Antis, MD  nitroGLYCERIN (NITROSTAT) 0.4 MG SL tablet Place 1 tablet (0.4 mg total) under the tongue every 5 (five) minutes as needed for chest pain. 11/15/19   Nahser, Deloris Ping, MD  ondansetron (ZOFRAN-ODT) 4 MG disintegrating tablet Take 1 tablet (4 mg total) by mouth every 8 (eight) hours as needed for nausea or vomiting. 09/15/22   Minna Antis, MD  rosuvastatin (CRESTOR) 5 MG tablet TAKE 1 TABLET BY MOUTH  DAILY 09/23/22   Nahser, Deloris Ping, MD     Vital Signs:   Code Status:   Physical Exam  Imaging: No results found.  Labs:  CBC: Recent Labs    09/15/22 1605  WBC 4.1  HGB 12.9  HCT 39.6  PLT 196    COAGS: No results for input(s): "INR", "APTT" in the last 8760 hours.  BMP: Recent Labs    09/15/22 1605  NA 139  K 3.5  CL 105  CO2 27  GLUCOSE 108*  BUN 13  CALCIUM 8.7*  CREATININE 0.80  GFRNONAA  >60    LIVER FUNCTION TESTS: Recent Labs    09/15/22 1605  BILITOT 0.5  AST 25  ALT 31  ALKPHOS 55  PROT 7.5  ALBUMIN 4.2    Assessment and Plan: 57 year old female with past medical history significant for hyperlipidemia, thyroiditis 2018, melanoma in situ and a symptomatic left renal cyst first identified in January of this year.  Workup at that time was positive for UTI and CT revealing a left renal septated cyst.  In April of this year patient underwent MRI which characterized the cyst as a 9 cm Bosniak category 20F cystic lesion in the lower pole left kidney with repeat imaging in June of this year showing no significant change to the cyst.  Following recent consultation with Dr. Elby Showers on 06/24/23 patient was deemed an appropriate candidate for image guided left renal cyst aspiration and sclerotherapy and presents today for the procedure.Risks and benefits of procedure was discussed with the patient including, but not limited to bleeding, infection, damage to adjacent structures . All of the questions were answered and there is agreement to proceed.  Consent signed and in chart.    Electronically Signed: D. Jeananne Rama, PA-C 07/29/2023, 12:33 PM   I spent a total of 25 minutes at the the patient's bedside AND on the patient's hospital floor or unit, greater than 50% of which was counseling/coordinating care for image guided left renal cyst aspiration and sclerotherapy

## 2023-07-30 ENCOUNTER — Ambulatory Visit (HOSPITAL_COMMUNITY)
Admission: RE | Admit: 2023-07-30 | Discharge: 2023-07-30 | Disposition: A | Discharge status: Home / Self Care | Payer: BC Managed Care – PPO | Source: Ambulatory Visit | Attending: Interventional Radiology | Admitting: Interventional Radiology

## 2023-07-30 ENCOUNTER — Encounter (HOSPITAL_COMMUNITY): Payer: Self-pay

## 2023-07-30 ENCOUNTER — Other Ambulatory Visit: Payer: Self-pay

## 2023-07-30 DIAGNOSIS — N281 Cyst of kidney, acquired: Secondary | ICD-10-CM | POA: Insufficient documentation

## 2023-07-30 DIAGNOSIS — E785 Hyperlipidemia, unspecified: Secondary | ICD-10-CM | POA: Insufficient documentation

## 2023-07-30 HISTORY — PX: IR US GUIDE TISSUE ABLATION: IMG5113

## 2023-07-30 LAB — CBC WITH DIFFERENTIAL/PLATELET
Abs Immature Granulocytes: 0.02 10*3/uL (ref 0.00–0.07)
Basophils Absolute: 0 10*3/uL (ref 0.0–0.1)
Basophils Relative: 1 %
Eosinophils Absolute: 0.1 10*3/uL (ref 0.0–0.5)
Eosinophils Relative: 2 %
HCT: 43.1 % (ref 36.0–46.0)
Hemoglobin: 14.7 g/dL (ref 12.0–15.0)
Immature Granulocytes: 0 %
Lymphocytes Relative: 35 %
Lymphs Abs: 2.2 10*3/uL (ref 0.7–4.0)
MCH: 29.5 pg (ref 26.0–34.0)
MCHC: 34.1 g/dL (ref 30.0–36.0)
MCV: 86.4 fL (ref 80.0–100.0)
Monocytes Absolute: 0.4 10*3/uL (ref 0.1–1.0)
Monocytes Relative: 6 %
Neutro Abs: 3.5 10*3/uL (ref 1.7–7.7)
Neutrophils Relative %: 56 %
Platelets: 263 10*3/uL (ref 150–400)
RBC: 4.99 MIL/uL (ref 3.87–5.11)
RDW: 11.9 % (ref 11.5–15.5)
WBC: 6.2 10*3/uL (ref 4.0–10.5)
nRBC: 0 % (ref 0.0–0.2)

## 2023-07-30 LAB — BASIC METABOLIC PANEL
Anion gap: 10 (ref 5–15)
BUN: 15 mg/dL (ref 6–20)
CO2: 25 mmol/L (ref 22–32)
Calcium: 9.6 mg/dL (ref 8.9–10.3)
Chloride: 104 mmol/L (ref 98–111)
Creatinine, Ser: 0.67 mg/dL (ref 0.44–1.00)
GFR, Estimated: >60 mL/min (ref >60)
Glucose, Bld: 94 mg/dL (ref 70–99)
Potassium: 3.8 mmol/L (ref 3.5–5.1)
Sodium: 139 mmol/L (ref 135–145)

## 2023-07-30 LAB — PROTIME-INR
INR: 1 (ref 0.8–1.2)
Prothrombin Time: 13.1 s (ref 11.4–15.2)

## 2023-07-30 LAB — GLUCOSE, CAPILLARY: Glucose-Capillary: 86 mg/dL (ref 70–99)

## 2023-07-30 MED ORDER — IOHEXOL 300 MG/ML  SOLN
50.0000 mL | Freq: Once | INTRAMUSCULAR | Status: AC | PRN
  Administered 2023-07-30: 10 mL

## 2023-07-30 MED ORDER — ALCOHOL (ABLYSINOL) 99% IA SOLN
100.0000 mL | Freq: Once | INTRA_ARTERIAL | Status: AC
  Administered 2023-07-30: 60 mL
  Filled 2023-07-30: qty 100

## 2023-07-30 MED ORDER — FENTANYL CITRATE (PF) 100 MCG/2ML IJ SOLN
INTRAMUSCULAR | Status: AC | PRN
  Administered 2023-07-30: 50 ug via INTRAVENOUS

## 2023-07-30 MED ORDER — SODIUM CHLORIDE 0.9 % IV SOLN: INTRAVENOUS | Status: DC

## 2023-07-30 MED ORDER — MIDAZOLAM HCL 2 MG/2ML IJ SOLN
INTRAMUSCULAR | Status: AC | PRN
  Administered 2023-07-30: 1 mg via INTRAVENOUS

## 2023-07-30 MED ORDER — LIDOCAINE HCL 1 % IJ SOLN
20.0000 mL | Freq: Once | INTRAMUSCULAR | Status: AC
  Administered 2023-07-30: 18 mL via INTRADERMAL

## 2023-07-30 MED ORDER — LIDOCAINE HCL 1 % IJ SOLN
INTRAMUSCULAR | Status: AC
  Filled 2023-07-30: qty 20

## 2023-07-30 MED ORDER — FENTANYL CITRATE (PF) 100 MCG/2ML IJ SOLN
INTRAMUSCULAR | Status: AC
  Filled 2023-07-30: qty 4

## 2023-07-30 MED ORDER — MIDAZOLAM HCL 2 MG/2ML IJ SOLN
INTRAMUSCULAR | Status: AC
  Filled 2023-07-30: qty 4

## 2023-07-30 MED ORDER — ALCOHOL (ABLYSINOL) 99% IA SOLN
INTRA_ARTERIAL | Status: AC
  Filled 2023-07-30: qty 50

## 2023-07-30 NOTE — Progress Notes (Signed)
After pt got dressed independently for discharge, pt reports feeling light headed, dizzy and clammy. Pt then instructed to lay back on stretcher and positioned in reverse trendelenburg. BP 73/51 at this time. #22g IV established on left AC and normal saline IV bolus started. Heart rate remained in the 50-70s and CBG = 86.  Dr. Elby Showers notifed. Per MD, monitor pt until fully stable before discharging. Pt given cola and crackers which pt tolerated well. After 700 ml of fluid infused, pt reports "feeling much better." Vital sign trends improved with SBP >100. Pt then able to sit up and transfer to wheelchair without feeling dizzy or light headed. IV discontinued and pt taken to main entrance via wheelchair accompanied by RN for discharge. No complaints at this time and no s/s of distress. Pt's husband present for discharge and updated of series of events. Pt's husband verbalized understanding.

## 2023-07-30 NOTE — Sedation Documentation (Signed)
15 minute timer set for position change.

## 2023-07-30 NOTE — Sedation Documentation (Signed)
Patient turned supine

## 2023-07-30 NOTE — Sedation Documentation (Signed)
Patient transported to IR procedure room via stretcher by this RN.

## 2023-07-30 NOTE — Sedation Documentation (Signed)
Patient turned right decubitus.

## 2023-07-30 NOTE — Discharge Instructions (Signed)
Please call Interventional Radiology clinic [phone] with any questions or concerns.  You may remove your dressing and shower tomorrow.  After your procedure it may be common to have: Some pain, bruising, swelling, or a burning feeling near the spot where the needle entered your skin (injection site). You may have severe pain if the alcohol leaks into nearby tissue Pain or a burning feeling around your jaw or behind your ears A fever  Follow these instructions at home:  Medication: Do not use Aspirin or ibuprofen products, such as Advil or Motrin, as it may increase bleeding.  You may resume your usual medications as ordered by your doctor If your doctor prescribed antibiotics, take them as directed. Do not stop taking them just because you feel better  Eating and drinking: Drink plenty of liquids to keep your urine pale yellow You can resume your regular diet as directed by your doctor   Care of the procedure site Follow instructions from your health care provider about how to take care of the injection site. Make sure you: Wash your hands with soap and water for at least 20 seconds before and after you change your bandage (dressing). If soap and water are not available, use hand sanitizer Change your dressing as told by your provider Check the injection site every day for signs of infection. Check for: More redness, swelling, or pain More fluid or blood Warmth Pus or a bad smell Activity Rest as told by your provider Do not sit for a long time without moving. Get up to take short walks every 1-2 hours. This will improve blood flow and breathing. Ask for help if you feel weak or unsteady Return to your normal activities as told by your provider Do not take baths, swim, or use a hot tub until your health care provider approves. Take showers only Keep all follow-up visits as told by your doctor  Contact a health care provider if: Your voice becomes hoarse, or you have trouble  speaking or swallowing Your fingers, toes, or lips start to tingle You get muscle cramps or sudden muscle tightening (spasms) You feel weak or more tired than normal You feel anxious or nervous You have a fever that lasts more than 1 day You have any signs of infection near the injection site  Get help right away if: You have severe pain that does not get better with medicine You have trouble breathing You start to make high-pitching whistling sounds when you breathe, most often when you breathe out (wheeze)  Moderate Conscious Sedation-Care After  This sheet gives you information about how to care for yourself after your procedure. Your health care provider may also give you more specific instructions. If you have problems or questions, contact your health care provider.  After the procedure, it is common to have: Sleepiness for several hours. Impaired judgment for several hours. Difficulty with balance. Vomiting if you eat too soon.  Follow these instructions at home:  Rest. Do not participate in activities where you could fall or become injured. Do not drive or use machinery. Do not drink alcohol. Do not take sleeping pills or medicines that cause drowsiness. Do not make important decisions or sign legal documents. Do not take care of children on your own.  Eating and drinking Follow the diet recommended by your health care provider. Drink enough fluid to keep your urine pale yellow. If you vomit: Drink water, juice, or soup when you can drink without vomiting. Make sure you have  little or no nausea before eating solid foods.  General instructions Take over-the-counter and prescription medicines only as told by your health care provider. Have a responsible adult stay with you for the time you are told. It is important to have someone help care for you until you are awake and alert. Do not smoke. Keep all follow-up visits as told by your health care provider. This is  important.  Contact a health care provider if: You are still sleepy or having trouble with balance after 24 hours. You feel light-headed. You keep feeling nauseous or you keep vomiting. You develop a rash. You have a fever. You have redness or swelling around the IV site.  Get help right away if: You have trouble breathing. You have new-onset confusion at home.  This information is not intended to replace advice given to you by your health care provider. Make sure you discuss any questions you have with your healthcare provider.

## 2023-07-30 NOTE — Sedation Documentation (Signed)
Patient transported to IR HB 2 via stretcher by this RN.

## 2023-07-30 NOTE — Sedation Documentation (Signed)
Patient turned left decubitus.

## 2023-07-31 ENCOUNTER — Other Ambulatory Visit: Payer: Self-pay | Admitting: Interventional Radiology

## 2023-07-31 DIAGNOSIS — N281 Cyst of kidney, acquired: Secondary | ICD-10-CM

## 2023-07-31 LAB — CYTOLOGY - NON PAP

## 2023-08-27 NOTE — Progress Notes (Signed)
Reason for follow up: The patient is seen in virtual telephone follow up today s/p renal cyst aspiration with ethanol sclerotherapy   Referring Physician(s): Winter,Christopher Clifton Custard   History of present illness: HPI from initial consultation 06/24/23 Kim Lee is a 57 y.o. female with a medical history significant for hyperlipidemia, melanoma in situ and a left renal cyst first identified January 2024. She presented to the ED 09/15/22 with complaints of back pain, fever and burning with urination. Work up in the ED was positive for a UTI and a CT scan showed a left renal septated cyst. She was treated with supportive care in the ED and discharged with outpatient follow up.     She underwent outpatient MRI in April 2024 which characterized the cyst as a 9 cm Bosniak category 93F cystic lesion in the lower pole of the left kidney. Repeat imaging in June 2024 showed no significant change to the cyst.    She was referred to Interventional Radiology by her Urologist, Dr. Liliane Shi to discuss possible treatment options. She presents today via virtual telephone visit. She has experienced significant pain from the cyst which has required opioid pain medication.  The pain has slightly improved, requiring tylenol most days.  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  No fevers or chills. No hematuria.  The cyst appeared amenable for percutaneous aspiration, septation disruption, and ethanol sclerotherapy. We discussed the rationale, risks, benefits, and periprocedural expectations of cyst aspiration with sclerotherapy. She was interested in proceeding and underwent a technically successful aspiration with sclerotherapy of the mildly septated left inferior renal cyst 07/30/23. She tolerated the procedure well and was discharged home the same day.   She presents today for follow up via virtual tele-health visit. Overall her pain has resolved.  She does  describe some mild left discomfort when lying on the right side. No fevers, chills, hematuria, pyuria.    Past Medical History:  Diagnosis Date   Atypical chest pain    last chest pain spring 2022 and saw dr Melburn Popper, has never used nitro for   Family history of premature CAD    Hypercholesterolemia    Hyperlipidemia    Thyroid nodule    lov dr balan 03-20-2021   Thyroiditis 2018   tx with steroids lov q year dr Andria Meuse 03-20-2021   Wears contact lenses 03/21/2021   Wears glasses 03/21/2021    Past Surgical History:  Procedure Laterality Date   ANTERIOR AND POSTERIOR REPAIR WITH SACROSPINOUS FIXATION N/A 03/26/2021   Procedure: ANTERIOR AND POSTERIOR REPAIR WITH SACROSPINOUS FIXATION;  Surgeon: Richardean Chimera, MD;  Location: [facility] [place];  Service: Gynecology;  Laterality: N/A;   CYSTOSCOPY  03/26/2021   Procedure: CYSTOSCOPY;  Surgeon: Richardean Chimera, MD;  Location: [facility];  Service: Gynecology;;   IR RADIOLOGIST EVAL & MGMT  06/24/2023   IR US GUIDE TISSUE ABLATION  07/30/2023   LAPAROSCOPIC VAGINAL HYSTERECTOMY WITH SALPINGO OOPHORECTOMY Bilateral 03/26/2021   Procedure: LAPAROSCOPIC ASSISTED VAGINAL HYSTERECTOMY WITH BILATERAL SALPINGO OOPHORECTOMY;  Surgeon: Richardean Chimera, MD;  Location: [facility] [place];  Service: Gynecology;  Laterality: Bilateral;  abdomen   MASS EXCISION  07/30/2011   Procedure: EXCISION MASS;  Surgeon: Nicki Reaper, MD;  Location: [facility];  Service: Orthopedics;  Laterality: Left;  left hand   TUBAL LIGATION  09/16/1999   WISDOM TOOTH EXTRACTION     AS TEENAGER    Allergies:  Lipitor [atorvastatin] and Mupirocin  Medications: Prior to Admission medications   Medication Sig Start Date End Date Taking? Authorizing Provider  colchicine 0.6 MG tablet Take 1 tablet by mouth twice daily for 3-4 weeks 03/27/22   Nahser, Deloris Ping, MD  estradiol (ESTRACE) 0.1 MG/GM vaginal cream Place vaginally. 12/18/21    [provider]  ezetimibe (ZETIA) 10 MG tablet TAKE 1 TABLET BY MOUTH DAILY 09/23/22   Nahser, Deloris Ping, MD  ketorolac (TORADOL) 10 MG tablet Take 1 tablet (10 mg total) by mouth every 6 (six) hours as needed. 09/15/22   Minna Antis, MD  nitroGLYCERIN (NITROSTAT) 0.4 MG SL tablet Place 1 tablet (0.4 mg total) under the tongue every 5 (five) minutes as needed for chest pain. 11/15/19   Nahser, Deloris Ping, MD  ondansetron (ZOFRAN-ODT) 4 MG disintegrating tablet Take 1 tablet (4 mg total) by mouth every 8 (eight) hours as needed for nausea or vomiting. 09/15/22   Minna Antis, MD  rosuvastatin (CRESTOR) 5 MG tablet TAKE 1 TABLET BY MOUTH DAILY 09/23/22   Nahser, Deloris Ping, MD     Family History  Problem Relation Age of Onset   Diabetes Father    Cancer Father        pancreatic   Coronary artery disease Mother        with stents   Hypertension Mother    Cancer Mother        lung   Cancer Sister        breast   Cancer Sister        leukemia   Hypertension Sister     Social History   Socioeconomic History   Marital status: Married    Spouse name: Not on file   Number of children: Not on file   Years of education: Not on file   Highest education level: Not on file  Occupational History   Not on file  Tobacco Use   Smoking status: Former    Current packs/day: 0.00    Average packs/day: 1 pack/day for 15.0 years (15.0 ttl pk-yrs)    Types: Cigarettes    Start date: 05/04/1984    Quit date: 05/05/1999    Years since quitting: 24.3   Smokeless tobacco: Never  Vaping Use   Vaping status: Never Used  Substance and Sexual Activity   Alcohol use: Yes    Comment: occ   Drug use: No   Sexual activity: Yes  Other Topics Concern   Not on file  Social History Narrative   Not on file   Social Drivers of Health   Financial Resource Strain: Not on file  Food Insecurity: Not on file  Transportation Needs: Not on file  Physical Activity: Not on file  Stress: Not on file   Social Connections: Unknown (01/24/2022)   [facility] from [facility]   Social Network    Social Network: Not on file     Vital Signs: There were no vitals taken for this visit.  No physical exam was performed in lieu of virtual telephone visit.    Imaging: MRI abdomen 03/04/23   IR renal cyst sclerotherapy (07/30/23)     Labs:  CBC: Recent Labs    09/15/22 1605 07/30/23 1200  WBC 4.1 6.2  HGB 12.9 14.7  HCT 39.6 43.1  PLT 196 263    COAGS: Recent Labs    07/30/23 1200  INR 1.0    BMP: Recent Labs    09/15/22 1605 07/30/23 1200  NA 139 139  K 3.5 3.8  CL 105 104  CO2 27 25  GLUCOSE 108* 94  BUN 13 15  CALCIUM 8.7* 9.6  CREATININE 0.80 0.67  GFRNONAA >60 >60    LIVER FUNCTION TESTS: Recent Labs    09/15/22 1605  BILITOT 0.5  AST 25  ALT 31  ALKPHOS 55  PROT 7.5  ALBUMIN 4.2    Assessment and Plan: 57 year old female with a history of a painful, Bosniak category 61F cyst in the lower pole of the left kidney. She underwent a technically successful aspiration with ethanol sclerotherapy of the mildly septated left inferior renal cyst 07/30/23.  Her symptoms have significantly improved since treatment.    Follow up forthcoming MRI abdomen to assess resolution. Follow up in IR clinic in 6 months, or sooner if needed.  Marliss Coots, MD Pager: [phone]    I spent a total of 25 Minutes in virtual telephone clinical consultation, greater than 50% of which was counseling/coordinating care for symptomatic left renal cyst.

## 2023-08-31 ENCOUNTER — Ambulatory Visit
Admission: RE | Admit: 2023-08-31 | Discharge: 2023-08-31 | Disposition: A | Discharge status: Home / Self Care | Payer: BC Managed Care – PPO | Source: Ambulatory Visit | Attending: Interventional Radiology | Admitting: Interventional Radiology

## 2023-08-31 DIAGNOSIS — N281 Cyst of kidney, acquired: Secondary | ICD-10-CM

## 2023-08-31 HISTORY — PX: IR RADIOLOGIST EVAL & MGMT: IMG5224

## 2023-09-07 NOTE — Addendum Note (Signed)
Encounter addended by: Arby Barrette on: 09/07/2023 3:23 PM  Actions taken: Imaging Exam ended, Charge Capture section accepted

## 2023-10-12 ENCOUNTER — Other Ambulatory Visit: Payer: Self-pay | Admitting: Urology

## 2023-10-12 ENCOUNTER — Encounter: Payer: Self-pay | Admitting: Urology

## 2023-10-12 DIAGNOSIS — N281 Cyst of kidney, acquired: Secondary | ICD-10-CM

## 2023-11-21 ENCOUNTER — Inpatient Hospital Stay: Admission: RE | Admit: 2023-11-21 | Payer: Self-pay | Source: Ambulatory Visit

## 2023-11-26 NOTE — Progress Notes (Unsigned)
 Cardiology Office Note   Date:  11/27/2023   ID:  Kim Lee, DOB 06-22-1966, MRN [id number]  PCP:  Pcp, No  Cardiologist: formerly Cassell Clement MD, now Kaede Clendenen   No chief complaint on file.       Kim Lee is a 58 y.o. female who presents for 1 year follow-up office visit   This pleasant 58 year old woman has a history of hypercholesterolemia.  She does not have any history of ischemic heart disease and she had a normal treadmill Cardiolite stress test 07/31/09.  She exercises regularly and does some jogging.  In November 2016 she recently had any half marathon down in [place].  He plans to do another half marathon this spring..  She has not been experiencing any chest pain.  .  The patient has a history of hypercholesterolemia and is on Lipitor.  She has been experiencing myalgias in her legs which she attributes to the Lipitor.  We will switch her to Crestor. She has a past history of anemia.  She takes Flintstones chewable multivitamins.  She is on a birth control pill to help with her heavy periods.  February 15, 2016:  Kim Lee is seen for the first time  - transfer from Dr. Patty Sermons. She was changed from atorvastatin to Crestor last year. She was having lots of leg pain with the atorvastatin. She still has some leg pain although it seems to be tolerable. She does comment that the Crestor causes her to wake up every night at 3 AM. She's requested changing it to a morning dose.  Still running  - doing a [employer] run in [employer] .   She had a dizzy spell several weeks ago.  Went to [facility] the next day .  Work up was normal .   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 5, 2018:  Kim Lee is seen For follow-up of her hyperlipidemia. No CP or dyspnea.  Still runs regularly  - doing a 1/2 marathon in several weeks .  Nov. 4, 2019:  Kim Lee is seen today for follow-up of her  hyperlipidemia.  She is had some atypical chest pain in the past.  She has a family history of premature coronary artery disease.  Ran a 1/2 marathon in Sept.  Had intrascapular pain .  Started at mile 7 and lasted until the rest of the race.  No dyspnea,   No dyspnea  Has run since then and has not had any episodes.  Tolerating the crestor but only takes it for several months before she comes for blood work   November 15, 2019  Kim Lee is seen today for follow up of her hyperlipidemia She has been very active. Has family hx of premature CAD  - mother had an MI at 58   Has some occasional leeft upper check pain  Has started back running .   Pains last  1-2 minutes She takes deep breaths whiich seems to help Radiates to mid chest and across her back - intrascapular ,  Down her left arm  These occur most times when she runs  Has been going on a month  Pains have not changed in severity   She runs 3 times a week .  Last cholesterol levels last Has been taking her rosuvastatin   December 26, 2020:   Kim Lee is seen today for follow-up for follow-up for non coronary chest pain.  She had a coronary CT angiogram which revealed a coronary calcium score of 0.  She had normal coronary arteries. Still having her CP .  Has not figured it out yet.  She has some chest wall tenderness.  I think that she probably has costochondritis.  We discussed having her take Motrin or Naprosyn. Has stopped running ( has some pevic support issues)  Has tried some advill which has helped   She is having some palpitations  Only last for a few seconds.  We discussed having her wear a monitor but she is not interested in having a monitor at this time.  She will let us know if these palpitations worsen.  March 03, 2022 Kim Lee is seen for follow up of her HLD and costochondritis Still having CP  - with exercise or without  Coronary calcium score is 0  CT of her spine incidentally found a trivial - small pericardial  effusion  Does not seem to be pleuritic ( but does occur with running  Has also tended to get worse with lying back   Previously saw [facility] Her doctor retired and she was "kicked out of the practice)"  November 27, 2023 Kim Lee is seen for follow up of her HLD  CAC score is 0 Was incidentally found to have a small pericardial effusion  We tried her on several weeks of colchicine to see if her pleurretic CP would improve   CP occurs with running but also at rest May last between 5 min and 15 minutes.   The colchicine did not seem to improve the CP  Still has Cp with exertion Coronary CTA in March 2021 was normal   Has not been so consistent with her crestor           Past Medical History:  Diagnosis Date   Atypical chest pain    last chest pain spring 2022 and saw dr Melburn Popper, has never used nitro for   Family history of premature CAD    Hypercholesterolemia    Hyperlipidemia    Thyroid nodule    lov dr balan 03-20-2021   Thyroiditis 2018   tx with steroids lov q year dr Andria Meuse 03-20-2021   Wears contact lenses 03/21/2021   Wears glasses 03/21/2021    Past Surgical History:  Procedure Laterality Date   ANTERIOR AND POSTERIOR REPAIR WITH SACROSPINOUS FIXATION N/A 03/26/2021   Procedure: ANTERIOR AND POSTERIOR REPAIR WITH SACROSPINOUS FIXATION;  Surgeon: Richardean Chimera, MD;  Location: [facility] [place];  Service: Gynecology;  Laterality: N/A;   CYSTOSCOPY  03/26/2021   Procedure: CYSTOSCOPY;  Surgeon: Richardean Chimera, MD;  Location: [facility];  Service: Gynecology;;   IR RADIOLOGIST EVAL & MGMT  06/24/2023   IR RADIOLOGIST EVAL & MGMT  08/31/2023   IR US GUIDE TISSUE ABLATION  07/30/2023   LAPAROSCOPIC VAGINAL HYSTERECTOMY WITH SALPINGO OOPHORECTOMY Bilateral 03/26/2021   Procedure: LAPAROSCOPIC ASSISTED VAGINAL HYSTERECTOMY WITH BILATERAL SALPINGO OOPHORECTOMY;  Surgeon: Richardean Chimera, MD;  Location: [facility] [place];  Service:  Gynecology;  Laterality: Bilateral;  abdomen   MASS EXCISION  07/30/2011   Procedure: EXCISION MASS;  Surgeon: Nicki Reaper, MD;  [facility];  Service: Orthopedics;  Laterality: Left;  left hand   TUBAL LIGATION  09/16/1999   WISDOM TOOTH EXTRACTION     AS TEENAGER     Current Outpatient Medications  Medication Sig Dispense Refill   estradiol (ESTRACE) 0.1 MG/GM vaginal cream Place vaginally.  ezetimibe (ZETIA) 10 MG tablet TAKE 1 TABLET BY MOUTH DAILY 90 tablet 3   nitroGLYCERIN (NITROSTAT) 0.4 MG SL tablet Place 1 tablet (0.4 mg total) under the tongue every 5 (five) minutes as needed for chest pain. 25 tablet 6   rosuvastatin (CRESTOR) 5 MG tablet TAKE 1 TABLET BY MOUTH DAILY 90 tablet 3   No current facility-administered medications for this visit.    Allergies:   Lipitor [atorvastatin], Gadolinium derivatives, and Mupirocin    Social History:  The patient  reports that she quit smoking about 24 years ago. Her smoking use included cigarettes. She started smoking about 39 years ago. She has a 15 pack-year smoking history. She has never used smokeless tobacco. She reports current alcohol use. She reports that she does not use drugs.   Family History:  The patient's family history includes Cancer in her father, mother, sister, and sister; Coronary artery disease in her mother; Diabetes in her father; Hypertension in her mother and sister. her mother had multiple stents after age 92   ROS:  Please see the history of present illness.   Otherwise, review of systems are positive for none.   All other systems are reviewed and negative.    Physical Exam: Blood pressure 130/82, pulse 65, height 5\' 5"  (1.651 m), weight 145 lb (65.8 kg), SpO2 97%.       GEN:  Well nourished, well developed in no acute distress HEENT: Normal NECK: No JVD; No carotid bruits LYMPHATICS: No lymphadenopathy CARDIAC: RRR , no murmurs, rubs, gallops RESPIRATORY:  Clear to  auscultation without rales, wheezing or rhonchi  ABDOMEN: Soft, non-tender, non-distended MUSCULOSKELETAL:  No edema; No deformity  SKIN: Warm and dry NEUROLOGIC:  Alert and oriented x 3  EKG:    EKG Interpretation Date/Time:  Friday November 27 2023 15:43:12 EDT Ventricular Rate:  65 PR Interval:  154 QRS Duration:  78 QT Interval:  378 QTC Calculation: 393 R Axis:   53  Text Interpretation: Normal sinus rhythm Normal ECG When compared with ECG of 21-Jul-2009 17:39, ST no longer depressed in Inferior leads Nonspecific T wave abnormality now evident in Lateral leads Confirmed by Kristeen Miss ([id number]) on 11/27/2023 4:00:53 PM     Recent Labs: 07/30/2023: BUN 15; Creatinine, Ser 0.67; Hemoglobin 14.7; Platelets 263; Potassium 3.8; Sodium 139    Lipid Panel    Component Value Date/Time   CHOL 200 (H) 03/04/2022 0819   TRIG 158 (H) 03/04/2022 0819   HDL 54 03/04/2022 0819   CHOLHDL 3.7 03/04/2022 0819   CHOLHDL 2 10/19/2020 1041   VLDL 20.2 10/19/2020 1041   LDLCALC 118 (H) 03/04/2022 0819      Wt Readings from Last 3 Encounters:  11/27/23 145 lb (65.8 kg)  07/30/23 150 lb (68 kg)  04/07/23 167 lb 4.8 oz (75.9 kg)         ASSESSMENT AND PLAN:  1.  Hypercholesterolemia  : CAC score is 0.  We have her on low-dose rosuvastatin.  Check lipids today.  2.  Chest discomfort: Coronary CT angiogram revealed a coronary calcium score of 0.  Her coronary arteries are normal.  She is continue to have episodes of chest discomfort but I doubt that these are due to coronary artery disease.  Her echocardiogram showed a small pericardial effusion again these chest pains do not sound like pericarditis.  She did not necessarily want to repeat her echocardiogram at some point we will probably need to take another look at her an echocardiogram.  She did not get a lot of relief on several months of colchicine.         Current medicines are reviewed at length with the patient today.  The  patient does not have concerns regarding medicines.  The following changes have been made:  no change  Labs/ tests ordered today include:   Orders Placed This Encounter  Procedures   ALT   Basic metabolic panel   Lipid panel   EKG 12-Lead     Kristeen Miss, MD  11/27/2023 4:23 PM    [facility] [address]  [address] [place], [place]  [address] Pager [phone] Phone: [phone]; Fax: [phone]

## 2023-11-27 ENCOUNTER — Encounter: Payer: Self-pay | Admitting: Cardiovascular Disease

## 2023-11-27 ENCOUNTER — Ambulatory Visit: Payer: 59 | Attending: Cardiovascular Disease | Admitting: Cardiovascular Disease

## 2023-11-27 VITALS — BP 130/82 | HR 65 | Ht 65.0 in | Wt 145.0 lb

## 2023-11-27 DIAGNOSIS — R0781 Pleurodynia: Secondary | ICD-10-CM | POA: Diagnosis not present

## 2023-11-27 DIAGNOSIS — E782 Mixed hyperlipidemia: Secondary | ICD-10-CM

## 2023-11-27 NOTE — Patient Instructions (Signed)
 Lab Work: Lipids, ALT, BMET today If you have labs (blood work) drawn today and your tests are completely normal, you will receive your results only by: MyChart Message (if you have MyChart) OR A paper copy in the mail If you have any lab test that is abnormal or we need to change your treatment, we will call you to review the results.  Follow-Up: At [facility], you and your health needs are our priority.  As part of our continuing mission to provide you with exceptional heart care, we have created designated Provider Care Teams.  These Care Teams include your primary Cardiologist (physician) and Advanced Practice Providers (APPs -  Physician Assistants and Nurse Practitioners) who all work together to provide you with the care you need, when you need it.  Your next appointment:   1 year(s)  Provider:   Nathaniel Man, MD     1st Floor: - Lobby - Registration  - Pharmacy  - Lab - Cafe  2nd Floor: - PV Lab - Diagnostic Testing (echo, CT, nuclear med)  3rd Floor: - Vacant  4th Floor: - TCTS (cardiothoracic surgery) - AFib Clinic - Structural Heart Clinic - Vascular Surgery  - Vascular Ultrasound  5th Floor: - HeartCare Cardiology (general and EP) - Clinical Pharmacy for coumadin, hypertension, lipid, weight-loss medications, and med management appointments    Valet parking services will be available as well.

## 2023-11-28 LAB — LIPID PANEL
Chol/HDL Ratio: 4.1 ratio (ref 0.0–4.4)
Cholesterol, Total: 224 mg/dL — ABNORMAL HIGH (ref 100–199)
HDL: 54 mg/dL (ref >39)
LDL Chol Calc (NIH): 137 mg/dL — ABNORMAL HIGH (ref 0–99)
Triglycerides: 188 mg/dL — ABNORMAL HIGH (ref 0–149)
VLDL Cholesterol Cal: 33 mg/dL (ref 5–40)

## 2023-11-28 LAB — BASIC METABOLIC PANEL
BUN/Creatinine Ratio: 21 (ref 9–23)
BUN: 16 mg/dL (ref 6–24)
CO2: 24 mmol/L (ref 20–29)
Calcium: 9.5 mg/dL (ref 8.7–10.2)
Chloride: 104 mmol/L (ref 96–106)
Creatinine, Ser: 0.77 mg/dL (ref 0.57–1.00)
Glucose: 88 mg/dL (ref 70–99)
Potassium: 4.7 mmol/L (ref 3.5–5.2)
Sodium: 141 mmol/L (ref 134–144)
eGFR: 90 mL/min/{1.73_m2} (ref >59)

## 2023-11-28 LAB — ALT: ALT: 15 IU/L (ref 0–32)

## 2023-11-29 ENCOUNTER — Encounter: Payer: Self-pay | Admitting: Cardiovascular Disease

## 2024-01-09 ENCOUNTER — Ambulatory Visit
Admission: RE | Admit: 2024-01-09 | Discharge: 2024-01-09 | Disposition: A | Discharge status: Home / Self Care | Source: Ambulatory Visit | Attending: Urology | Admitting: Urology

## 2024-01-09 DIAGNOSIS — N281 Cyst of kidney, acquired: Secondary | ICD-10-CM

## 2024-01-09 MED ORDER — GADOPICLENOL 0.5 MMOL/ML IV SOLN
7.0000 mL | Freq: Once | INTRAVENOUS | Status: AC | PRN
  Administered 2024-01-09: 7 mL via INTRAVENOUS

## 2024-04-07 ENCOUNTER — Ambulatory Visit: Payer: BC Managed Care – PPO | Admitting: Hematology and Oncology

## 2024-04-26 ENCOUNTER — Encounter: Payer: Self-pay | Admitting: Hematology and Oncology

## 2024-04-26 ENCOUNTER — Other Ambulatory Visit: Payer: Self-pay | Admitting: *Deleted

## 2024-04-26 ENCOUNTER — Inpatient Hospital Stay: Payer: Self-pay | Attending: Hematology and Oncology | Admitting: Hematology and Oncology

## 2024-04-26 VITALS — BP 140/84 | Temp 98.7°F | Resp 14 | Wt 155.8 lb

## 2024-04-26 DIAGNOSIS — Z800 Family history of malignant neoplasm of digestive organs: Secondary | ICD-10-CM | POA: Insufficient documentation

## 2024-04-26 DIAGNOSIS — Z803 Family history of malignant neoplasm of breast: Secondary | ICD-10-CM | POA: Diagnosis not present

## 2024-04-26 DIAGNOSIS — Z9189 Other specified personal risk factors, not elsewhere classified: Secondary | ICD-10-CM

## 2024-04-26 DIAGNOSIS — Z90710 Acquired absence of both cervix and uterus: Secondary | ICD-10-CM | POA: Diagnosis not present

## 2024-04-26 DIAGNOSIS — Z85820 Personal history of malignant melanoma of skin: Secondary | ICD-10-CM | POA: Diagnosis present

## 2024-04-26 DIAGNOSIS — Z90722 Acquired absence of ovaries, bilateral: Secondary | ICD-10-CM | POA: Insufficient documentation

## 2024-04-26 DIAGNOSIS — Z87891 Personal history of nicotine dependence: Secondary | ICD-10-CM | POA: Insufficient documentation

## 2024-04-26 DIAGNOSIS — Z801 Family history of malignant neoplasm of trachea, bronchus and lung: Secondary | ICD-10-CM | POA: Diagnosis not present

## 2024-04-26 NOTE — Progress Notes (Signed)
 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  We have calculated her lifetime risk of breast cancer per Tyrer-Cuzick model which comes to around 37%.  We have also calculated her 5-year risk of breast cancer per Gail's model which comes to around 2.8%.  Assessment and Plan Assessment & Plan High lifetime risk for breast cancer Tyrer cuzick score of 37% indicates high lifetime risk, influenced by family history. Regular mammogram completed. MRI screening less favorable due to contrast reaction. Discussed contrast mammograms as alternative.  Prefers lifestyle modifications over tamoxifen. - Order CT mammogram for August 2026 at [facility] 150 minutes of active exercise per week. - Discuss plant-based diet and weight management. - Advise against hormone replacement and prolonged birth control use. - Recommend monthly self breast exams and reporting any changes. - Alternate follow-up with gynecologist to avoid overlapping appointments.  Adverse reaction to MRI contrast (gadolinium) Rash from gadolinium-based MRI contrast requires premedication. Tolerates iodine -based contrast well    HISTORY OF PRESENTING ILLNESS:  Kim Lee 58 y.o. female is here because of high risk for Kim Lee.  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  She underwent genetic testing with [facility]. 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  She is healthy at baseline, Runner, broadcasting/film/video by occupation.  She had total hysterectomy couple years ago.  Discussed the use of AI scribe software for clinical note transcription with the patient, who gave verbal consent to proceed.  History of Present Illness Kim Lee is a 58 year old female with a high lifetime risk of breast cancer who presents for annual follow-up and discussion of breast cancer screening options.  She has a calculated lifetime risk of breast cancer at 37%, significantly higher than the general population, due to her family history. Her paternal grandmother died from breast cancer at the age of 25, which contributes to her concern about her own risk.  She recently experienced an adverse reaction to MRI contrast, manifesting as a rash, necessitating premedication for future MRIs. As a result, she is considering alternative screening methods, such as a contrast-enhanced mammogram using iodine -based contrast.  She is interested in lifestyle modifications to reduce her breast cancer risk, focusing on exercise and dietary changes. She recalls feeling more energetic and sleeping better when she used to run and is contemplating resuming running now that her knees and feet feel capable.  She has a history of hysterectomy and is experiencing postmenopausal symptoms, which are affecting her sleep.    MEDICAL HISTORY:  Past Medical History:  Diagnosis Date   Atypical chest pain    last chest pain spring 2022 and saw Kim Lee, has never used nitro for   Family history  of premature CAD    Hypercholesterolemia    Hyperlipidemia    Thyroid  nodule    lov Kim Lee 03-20-2021   Thyroiditis 2018   tx with steroids  lov q year Kim Lee lov 03-20-2021   Wears contact lenses 03/21/2021   Wears glasses 03/21/2021    SURGICAL HISTORY: Past Surgical History:  Procedure Laterality Date   ANTERIOR AND POSTERIOR REPAIR WITH SACROSPINOUS FIXATION N/A 03/26/2021   Procedure: ANTERIOR AND POSTERIOR REPAIR WITH SACROSPINOUS FIXATION;  Surgeon: Kim Rush, MD;  Location: [facility] [place];  Service: Gynecology;  Laterality: N/A;   CYSTOSCOPY  03/26/2021   Procedure: CYSTOSCOPY;  Surgeon: Kim Rush, MD;  Location: [facility];  Service: Gynecology;;   IR RADIOLOGIST EVAL & MGMT  06/24/2023   IR RADIOLOGIST EVAL & MGMT  08/31/2023   IR US  GUIDE TISSUE ABLATION  07/30/2023   LAPAROSCOPIC VAGINAL HYSTERECTOMY WITH SALPINGO OOPHORECTOMY Bilateral 03/26/2021   Procedure: LAPAROSCOPIC ASSISTED VAGINAL HYSTERECTOMY WITH BILATERAL SALPINGO OOPHORECTOMY;  Surgeon: Kim Rush, MD;  Location: [facility];  Service: Gynecology;  Laterality: Bilateral;  abdomen   MASS EXCISION  07/30/2011   Procedure: EXCISION MASS;  Surgeon: Kim JONELLE Curia, MD;  Location: [facility];  Service: Orthopedics;  Laterality: Left;  left hand   TUBAL LIGATION  09/16/1999   WISDOM TOOTH EXTRACTION     AS TEENAGER    SOCIAL HISTORY: Social History   Socioeconomic History   Marital status: Married    Spouse name: Not on file   Number of children: Not on file   Years of education: Not on file   Highest education level: Not on file  Occupational History   Not on file  Tobacco Use   Smoking status: Former    Current packs/day: 0.00    Average packs/day: 1 pack/day for 15.0 years (15.0 ttl pk-yrs)    Types: Cigarettes    Start date: 05/04/1984    Quit date: 05/05/1999    Years since quitting: 24.9   Smokeless tobacco: Never  Vaping Use   Vaping status: Never Used  Substance and Sexual Activity   Alcohol  use: Yes    Comment: occ   Drug use: No   Sexual activity: Yes  Other Topics  Concern   Not on file  Social History Narrative   Not on file   Social Drivers of Health   Financial Resource Strain: Not on file  Food Insecurity: Not on file  Transportation Needs: Not on file  Physical Activity: Not on file  Stress: Not on file  Social Connections: Unknown (01/24/2022)   Received from [facility]   Social Network    Social Network: Not on file  Intimate Partner Violence: Unknown (12/17/2021)   Received from [facility]   HITS    Physically Hurt: Not on file    Insult or Talk Down To: Not on file    Threaten Physical Harm: Not on file    Scream or Curse: Not on file    FAMILY HISTORY: Family History  Problem Relation Age of Onset   Diabetes Father    Cancer Father        pancreatic   Coronary artery disease Mother        with stents   Hypertension Mother    Cancer Mother        lung   Cancer Sister        breast   Cancer Sister  leukemia   Hypertension Sister     ALLERGIES:  is allergic to lipitor [atorvastatin ], gadolinium derivatives, and mupirocin.  MEDICATIONS:  Current Outpatient Medications  Medication Sig Dispense Refill   estradiol (ESTRACE) 0.1 MG/GM vaginal cream Place vaginally.     ezetimibe  (ZETIA ) 10 MG tablet TAKE 1 TABLET BY MOUTH DAILY 90 tablet 3   nitroGLYCERIN  (NITROSTAT ) 0.4 MG SL tablet Place 1 tablet (0.4 mg total) under the tongue every 5 (five) minutes as needed for chest pain. 25 tablet 6   rosuvastatin  (CRESTOR ) 5 MG tablet TAKE 1 TABLET BY MOUTH DAILY 90 tablet 3   No current facility-administered medications for this visit.     PHYSICAL EXAMINATION: ECOG PERFORMANCE STATUS: 0 - Asymptomatic  Vitals:   04/26/24 0911  BP: (!) 140/84  Resp: 14  Temp: 98.7 F (37.1 C)  SpO2: 98%    Filed Weights   04/26/24 0911  Weight: 155 lb 12.8 oz (70.7 kg)     GENERAL:alert, no distress and comfortable Bilateral breasts examined. No palpable masses. No regional adenopathy  LABORATORY DATA:  I have  reviewed the data as listed Lab Results  Component Value Date   WBC 6.2 07/30/2023   HGB 14.7 07/30/2023   HCT 43.1 07/30/2023   MCV 86.4 07/30/2023   PLT 263 07/30/2023     Chemistry      Component Value Date/Time   NA 141 11/27/2023 1647   K 4.7 11/27/2023 1647   CL 104 11/27/2023 1647   CO2 24 11/27/2023 1647   BUN 16 11/27/2023 1647   CREATININE 0.77 11/27/2023 1647   CREATININE 0.77 06/04/2016 0734      Component Value Date/Time   CALCIUM  9.5 11/27/2023 1647   ALKPHOS 55 09/15/2022 1605   AST 25 09/15/2022 1605   ALT 15 11/27/2023 1647   BILITOT 0.5 09/15/2022 1605   BILITOT 0.2 03/04/2022 0819       RADIOGRAPHIC STUDIES: I have personally reviewed the radiological images as listed and agreed with the findings in the report. No results found.  All questions were answered. The patient knows to call the clinic with any problems, questions or concerns. I spent 30 minutes in the care of this patient including H and P, review of records, counseling and coordination of care.     Amber Stalls, MD 04/26/2024 2:45 PM

## 2025-02-27 ENCOUNTER — Ambulatory Visit: Admitting: Cardiology

## 2025-04-25 ENCOUNTER — Ambulatory Visit: Admitting: Hematology and Oncology
